# Patient Record
Sex: Female | Born: 1980 | Race: Black or African American | Hispanic: No | Marital: Single | Smoking: Never smoker
Health system: Southern US, Community
[De-identification: ages and names within clinical notes are randomized; demographics above are authoritative.]

## PROBLEM LIST (undated history)

## (undated) DIAGNOSIS — I10 Essential (primary) hypertension: Secondary | ICD-10-CM

## (undated) HISTORY — PX: WISDOM TOOTH EXTRACTION: SHX21

---

## 2003-05-17 ENCOUNTER — Emergency Department (HOSPITAL_COMMUNITY): Admission: EM | Admit: 2003-05-17 | Discharge: 2003-05-17 | Payer: Self-pay | Admitting: Emergency Medicine

## 2004-09-26 ENCOUNTER — Emergency Department (HOSPITAL_COMMUNITY): Admission: EM | Admit: 2004-09-26 | Discharge: 2004-09-26 | Payer: Self-pay | Admitting: Emergency Medicine

## 2004-12-26 ENCOUNTER — Emergency Department (HOSPITAL_COMMUNITY): Admission: EM | Admit: 2004-12-26 | Discharge: 2004-12-26 | Payer: Self-pay | Admitting: Emergency Medicine

## 2005-03-26 ENCOUNTER — Inpatient Hospital Stay (HOSPITAL_COMMUNITY): Admission: AD | Admit: 2005-03-26 | Discharge: 2005-03-28 | Payer: Self-pay | Admitting: Obstetrics & Gynecology

## 2005-04-19 ENCOUNTER — Ambulatory Visit (HOSPITAL_COMMUNITY): Admission: RE | Admit: 2005-04-19 | Discharge: 2005-04-19 | Payer: Self-pay | Admitting: Obstetrics & Gynecology

## 2005-04-21 ENCOUNTER — Ambulatory Visit (HOSPITAL_COMMUNITY): Admission: AD | Admit: 2005-04-21 | Discharge: 2005-04-21 | Payer: Self-pay | Admitting: Obstetrics & Gynecology

## 2005-04-23 ENCOUNTER — Inpatient Hospital Stay (HOSPITAL_COMMUNITY): Admission: AD | Admit: 2005-04-23 | Discharge: 2005-05-01 | Payer: Self-pay | Admitting: Obstetrics & Gynecology

## 2006-07-19 IMAGING — CT CT ANGIO CHEST
2 of 5 series · 19 of 36 positions shown · IV contrast (CONTRAST)
Comparison: none

HISTORY: Dyspnea, decreased oxygen saturation, elevated d-dimer, recent
C-section

[Series 6089: — · axial · 0.79mm/px · z∈[+1621,+1826]mm · 16 of 383 slices shown (1 of 2)]
[im 21/383  lung]
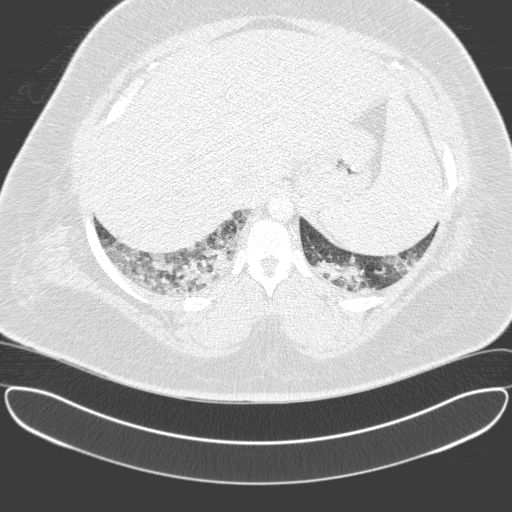
[im 41/383  mediastinal]
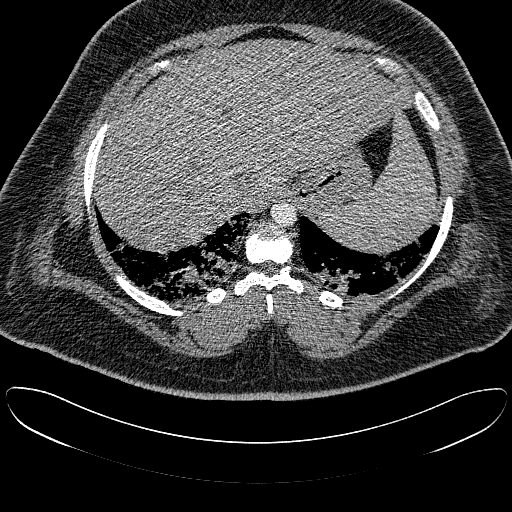
[im 61/383  lung]
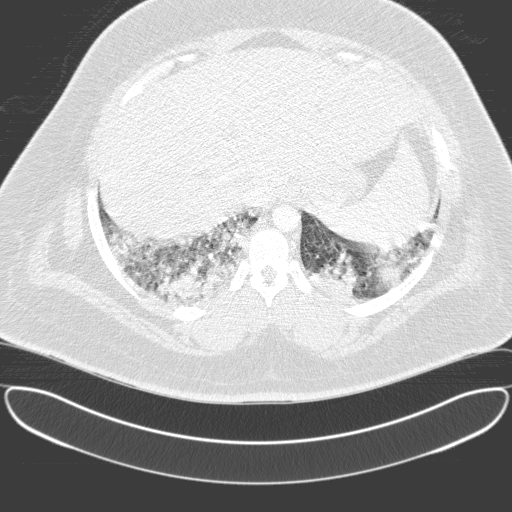
[im 81/383  mediastinal]
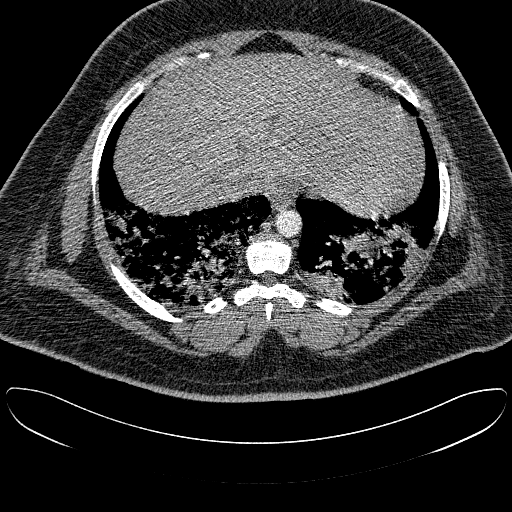
[im 121/383  lung]
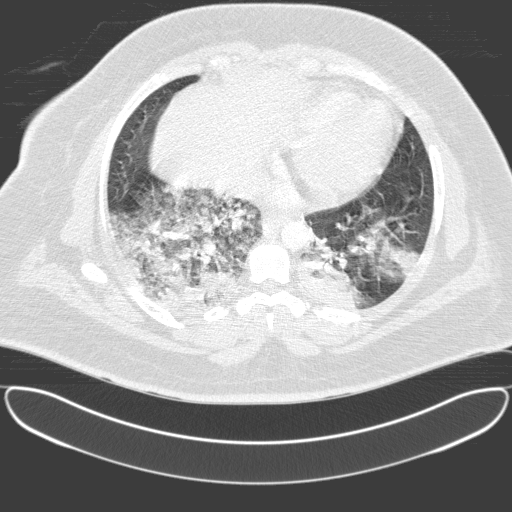
[im 141/383  mediastinal]
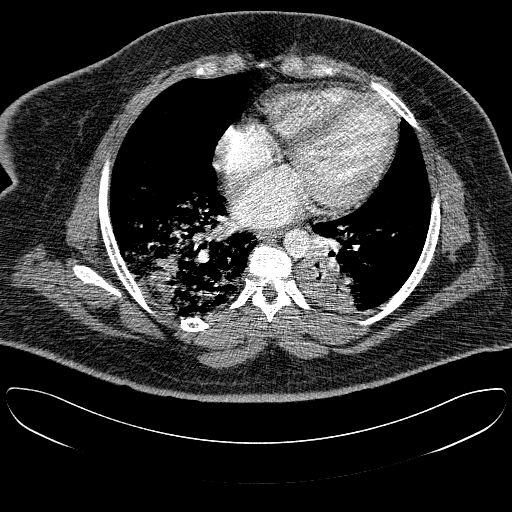
[im 161/383  lung]
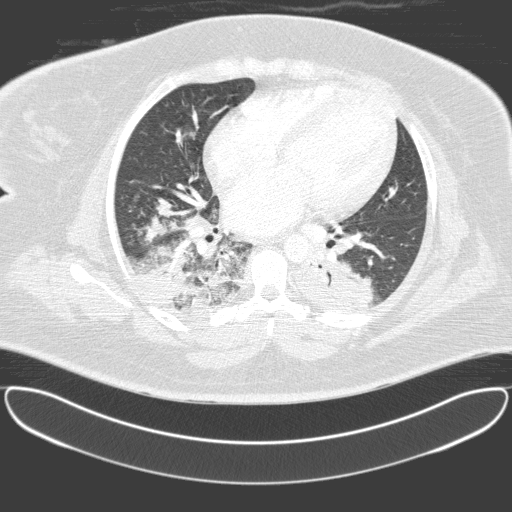
[im 181/383  mediastinal]
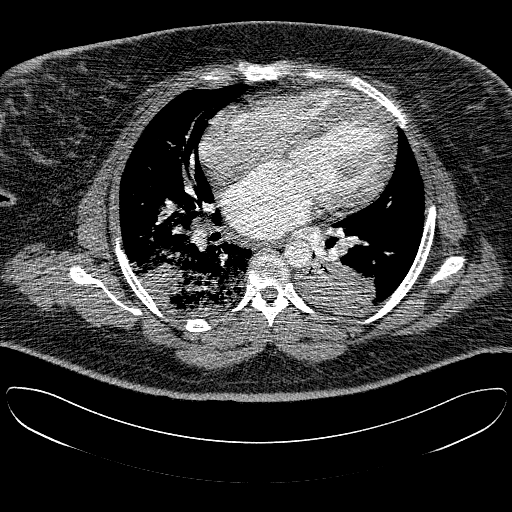
[im 202/383  lung]
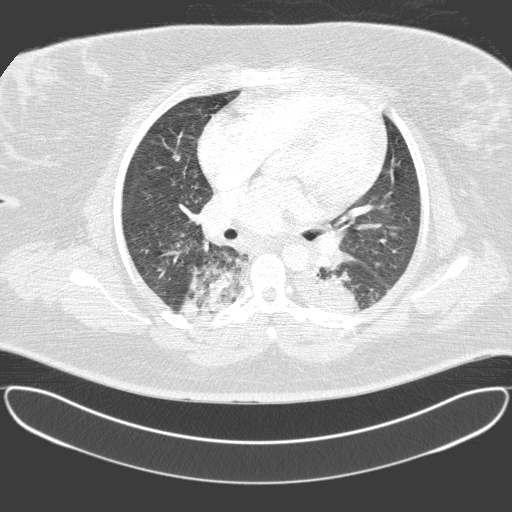
[im 222/383  mediastinal]
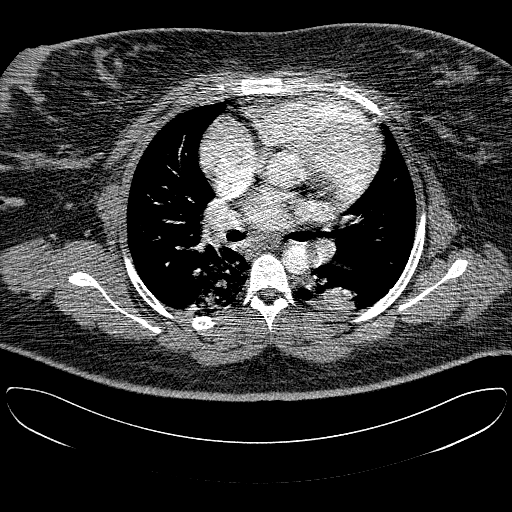
[im 242/383  lung]
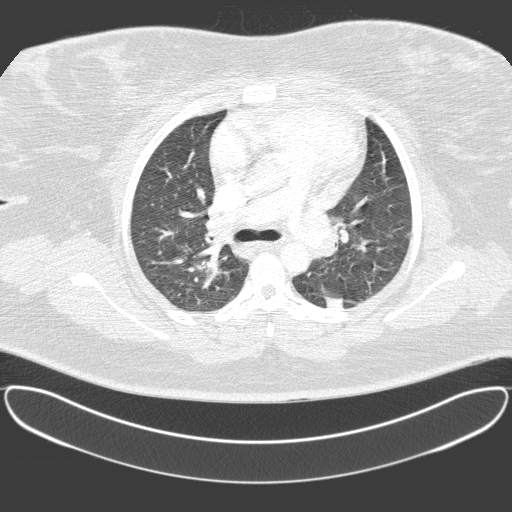
[im 262/383  mediastinal]
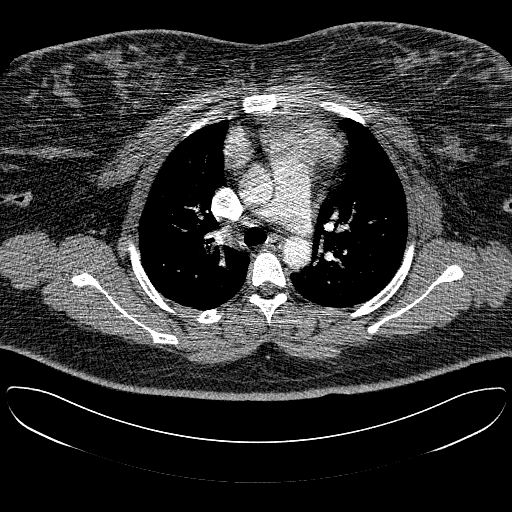
[im 302/383  lung]
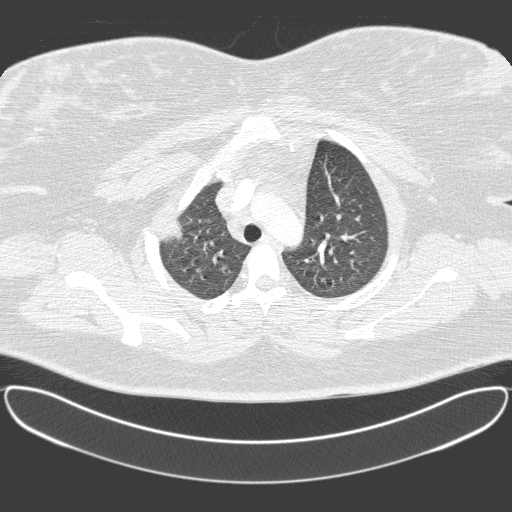
[im 322/383  mediastinal]
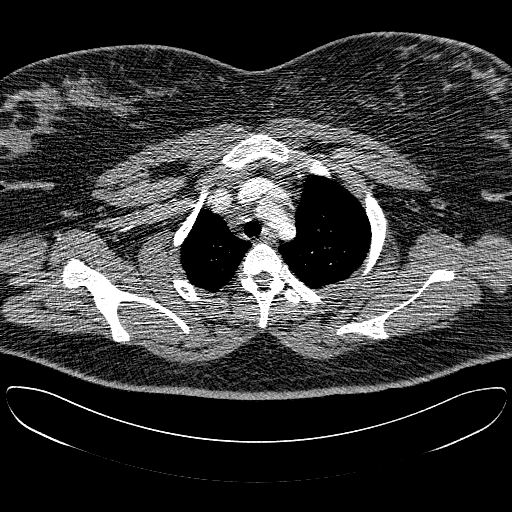
[im 342/383  lung]
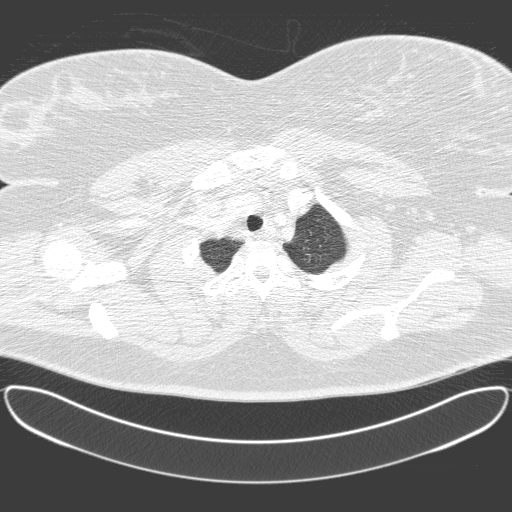
[im 362/383  mediastinal]
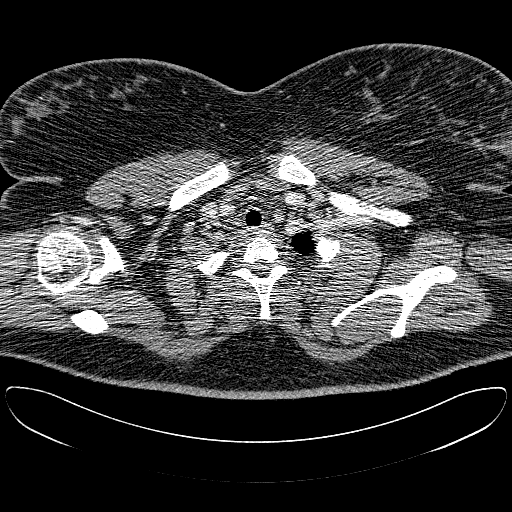

[— · coronal · 0.79mm/px · 3 of 90 slices shown (2 of 2)]
[im 18/90  mediastinal]
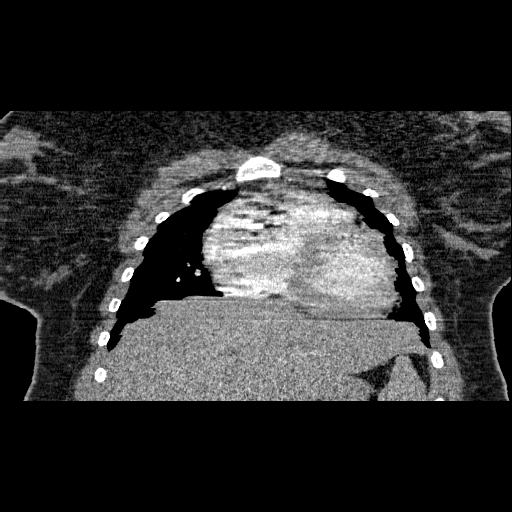
[im 36/90  mediastinal]
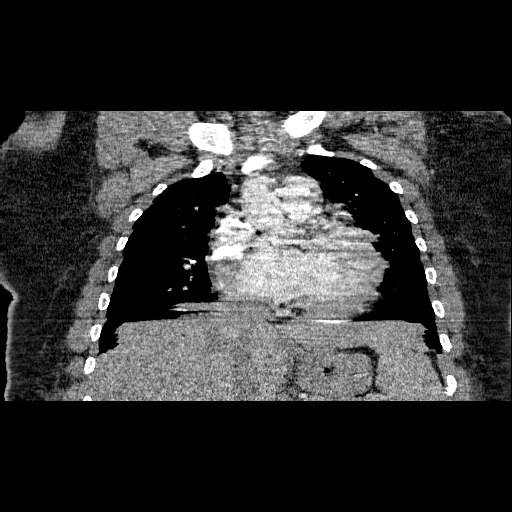
[im 54/90  mediastinal]
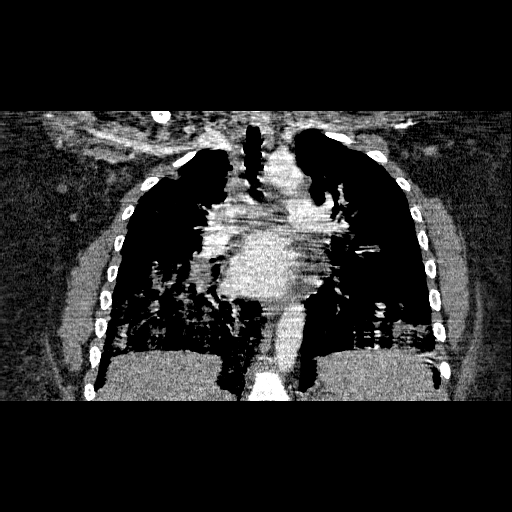

[19 of 36 positions shown; findings below may reference images not displayed]

CT ANGIO CHEST:

Multidetector helical CT imaging chest performed using pulmonary embolism
protocol following 150 cc [id number]. Additional coronal and sagittal images
are reconstructed from the axial data set to assist in evaluation of the
thoracic vasculature. 
No prior study for comparison.

Significant consolidation of bilateral lower lobes right greater than left.
Scattered respiratory motion artifacts.
Minimal right upper lobe infiltrate.
Extensive beam hardening secondary to patient's size.
No gross central pulmonary emboli seen though this exam is unable to exclude
pulmonary emboli due to technical limitations.
No mediastinal adenopathy or pleural effusion.
Bones unremarkable.
IMPRESSION: Extensive pulmonary infiltrates involving bilateral lower lobes and right upper
lobe.
Severely limited exam secondary to patient body habitus.
Nondiagnostic exam for presence of pulmonary emboli due to technical
limitations, unable to exclude pulmonary embolism.

## 2009-11-17 ENCOUNTER — Emergency Department (HOSPITAL_COMMUNITY): Admission: EM | Admit: 2009-11-17 | Discharge: 2009-11-17 | Payer: Self-pay | Admitting: Emergency Medicine

## 2010-01-10 ENCOUNTER — Emergency Department (HOSPITAL_COMMUNITY): Admission: EM | Admit: 2010-01-10 | Discharge: 2010-01-10 | Payer: Self-pay | Admitting: Emergency Medicine

## 2010-03-08 ENCOUNTER — Encounter: Payer: Self-pay | Admitting: Emergency Medicine

## 2010-04-30 LAB — GLUCOSE, CAPILLARY

## 2010-04-30 LAB — COMPREHENSIVE METABOLIC PANEL
ALT: 53 U/L — ABNORMAL HIGH (ref 0–35)
AST: 52 U/L — ABNORMAL HIGH (ref 0–37)
Alkaline Phosphatase: 53 U/L (ref 39–117)
CO2: 21 mEq/L (ref 19–32)
Calcium: 9 mg/dL (ref 8.4–10.5)
Chloride: 100 mEq/L (ref 96–112)
GFR calc non Af Amer: >60 mL/min (ref >60)
Glucose, Bld: 312 mg/dL — ABNORMAL HIGH (ref 70–99)
Sodium: 131 mEq/L — ABNORMAL LOW (ref 135–145)
Total Bilirubin: 0.2 mg/dL — ABNORMAL LOW (ref 0.3–1.2)

## 2010-04-30 LAB — URINALYSIS, ROUTINE W REFLEX MICROSCOPIC
Bilirubin Urine: NEGATIVE
Hgb urine dipstick: NEGATIVE
Ketones, ur: 15 mg/dL — AB
Nitrite: NEGATIVE
Urobilinogen, UA: 0.2 mg/dL (ref 0.0–1.0)

## 2010-04-30 LAB — URINE MICROSCOPIC-ADD ON

## 2010-04-30 LAB — DIFFERENTIAL
Basophils Absolute: 0.1 10*3/uL (ref 0.0–0.1)
Eosinophils Absolute: 0.1 10*3/uL (ref 0.0–0.7)
Eosinophils Relative: 2 % (ref 0–5)
Lymphs Abs: 1.7 10*3/uL (ref 0.7–4.0)
Monocytes Absolute: 0.4 10*3/uL (ref 0.1–1.0)
Neutrophils Relative %: 56 % (ref 43–77)

## 2010-04-30 LAB — CBC
HCT: 36 % (ref 36.0–46.0)
MCH: 24.1 pg — ABNORMAL LOW (ref 26.0–34.0)
MCV: 73.8 fL — ABNORMAL LOW (ref 78.0–100.0)
RDW: 15.1 % (ref 11.5–15.5)
WBC: 5.1 10*3/uL (ref 4.0–10.5)

## 2010-04-30 LAB — LIPASE, BLOOD: Lipase: 51 U/L (ref 11–59)

## 2010-04-30 LAB — KETONES, QUALITATIVE: Acetone, Bld: NEGATIVE

## 2010-04-30 LAB — POCT PREGNANCY, URINE: Preg Test, Ur: NEGATIVE

## 2010-07-04 NOTE — Op Note (Signed)
NAMESTARLIT, RABURN              ACCOUNT NO.:  [id number]   MEDICAL RECORD NO.:  [id number]          PATIENT TYPE:  INP   LOCATION:  A401                          FACILITY:  [facility]   PHYSICIAN:  Lazaro Arms, M.D.   DATE OF BIRTH:  08-31-1980   DATE OF PROCEDURE:  04/23/2005  DATE OF DISCHARGE:                                 OPERATIVE REPORT   PREOPERATIVE DIAGNOSES:  1.  Intrauterine pregnancy at 35 weeks' gestation.  2.  Severe preeclampsia.  3.  Insulin-dependent diabetes.  4.  Morbid obesity.   POSTOPERATIVE DIAGNOSES:  1.  Intrauterine pregnancy at 35 weeks' gestation.  2.  Severe preeclampsia.  3.  Insulin-dependent diabetes.  4.  Morbid obesity.   PROCEDURE:  Primary Cesarean section.   SURGEON:  Lazaro Arms, M.D.   ANESTHESIA:  Spinal.   FINDINGS:  Over a low transverse hysterotomy incision was delivered a viable  female infant with Apgars of 8 and 9 with weight to be determined in the  nursery. There was a three-vessel cord. Cord blood and cord gas sent. The  uterus, tubes and ovaries were normal. The placenta was normal. The infant  underwent routine neonatal resuscitation.   DESCRIPTION OF OPERATION:  The patient was taken to the operating room and  placed in a sitting position and underwent a spinal anesthetic, placed in a  supine position with a role under her right hip. She was prepped and draped  in usual sterile fashion. Pfannenstiel skin incision was made and carried  down sharply to the rectus fascia which was scored in the midline and  extended laterally. The fascial was taken off of the muscles superiorly and  inferiorly without difficulty. The muscles were divided. Peritoneal cavity  was entered. Bladder flap was placed. Vesicouterine serosal flaps were  created. Low transverse hysterotomy incision was made. Over this incision  was delivered a viable female infant, Apgars 8 and 9. The infant was handed to  Dr. Milford Cage for routine resuscitation. The  uterus was exteriorized. The  placenta was delivered spontaneously. The uterus was closed in two layers,  the first being a running interlocking layer, the second being an  imbricating layer. Several interrupted figure-of-eight sutures had to be  placed for hemostasis. The uterus, tubes and ovaries were normal. The uterus  was replaced into the peritoneal cavity. Both pericolic gutters were  irrigated and made hemostatic. The muscles and peritoneum were  reapproximated loosely. The fascia closed using 0 Vicryl running.  Subcutaneous tissue was made hemostatic and  irrigated. The skin was closed using skin staples. A JP drain was placed in  the subcutaneous tissue and exited in the right lower quadrant. The patient  tolerated the procedure well. I estimated the blood loss at 750 cc. She  received Ancef 1 g  prophylactically after cord was clamped.      Lazaro Arms, M.D.  Electronically Signed     LHE/MEDQ  D:  04/23/2005  T:  04/24/2005  Job:  [id number]

## 2010-07-04 NOTE — Group Therapy Note (Signed)
[name], [name]              ACCOUNT NO.:  [id number]   MEDICAL RECORD NO.:  [id number]          PATIENT TYPE:  INP   LOCATION:  A412                          FACILITY:  [facility]   PHYSICIAN:  Edward L. Juanetta Gosling, M.D.DATE OF BIRTH:  05/22/1980   DATE OF PROCEDURE:  04/29/2005  DATE OF DISCHARGE:                                   PROGRESS NOTE   PROBLEM:  Pneumonia.   SUBJECTIVE:  Ms. Kiger seems to be taking deeper breaths this morning.  She  is more alert.  She had a CT that was an indeterminate, but my suspicion is  not strong that she has got a pulmonary embolus anyway, so I think it is  okay to put her back on prophylactic dose of Lovenox.   OBJECTIVE:  VITAL SIGNS:  Her temperature 97.8, pulse 100, respirations 24,  blood pressure 144/80, blood sugar is 126, O2 saturations 98%.   The hemoglobin level is 8.4.   ASSESSMENT:  She seems better.   PLAN:  Plan is to continue her medications and treatments and follow.  No  other new treatments today.  I have discussed her situation with Dr. Despina Hidden.      Edward L. Juanetta Gosling, M.D.  Electronically Signed     ELH/MEDQ  D:  04/29/2005  T:  05/01/2005  Job:  [id number]

## 2010-07-04 NOTE — Group Therapy Note (Signed)
[name], [name]              ACCOUNT NO.:  [id number]   MEDICAL RECORD NO.:  [id number]          PATIENT TYPE:  INP   LOCATION:  A412                          FACILITY:  [facility]   PHYSICIAN:  Edward L. Juanetta Gosling, M.D.DATE OF BIRTH:  Jun 06, 1980   DATE OF PROCEDURE:  05/01/2005  DATE OF DISCHARGE:                                   PROGRESS NOTE   SUBJECTIVE:  [name] says she feels great and wants to go home.  She has  no new problems and no new complaints.  She is now on 2 L of O2.   OBJECTIVE:  GENERAL:  She is very comfortable in appearance.  CHEST:  Much clearer.  HEART:  Regular.  ABDOMEN:  Soft.   ASSESSMENT:  Dr. Despina Hidden feels that from his surgical point of view, she can go  home.  I feel the same as far as her pneumonia is concerned and she can take  her antibiotics as an outpatient.   PLAN:  She will be discharged to home.      Edward L. Juanetta Gosling, M.D.  Electronically Signed     ELH/MEDQ  D:  05/01/2005  T:  05/03/2005  Job:  [id number]

## 2010-07-04 NOTE — Group Therapy Note (Signed)
[name], [name]              ACCOUNT NO.:  [id number]   MEDICAL RECORD NO.:  [id number]          PATIENT TYPE:  INP   LOCATION:  A412                          FACILITY:  [facility]   PHYSICIAN:  Edward L. Juanetta Gosling, M.D.DATE OF BIRTH:  Sep 01, 1980   DATE OF PROCEDURE:  04/30/2005  DATE OF DISCHARGE:                                   PROGRESS NOTE   SUBJECTIVE:  [name] says she feels better and, in fact, looks much  better.  She is more alert.  She is breathing better.  She is coughing and  bringing up some sputum.  Otherwise, there does not seem to be a lot of  change.  She has not had any fever this morning.   OBJECTIVE:  CHEST:  Her chest is clear.  HEART:  Heart is regular.   Lab work is pending.   ASSESSMENT:  Overall, I think she is getting better.  She does have asthma.  She has pneumonia.  She has morbid obesity.  She has just had a C-section  and she is receiving prophylactic dose of Lovenox.  Basically, however, she  seems to be doing fairly well and getting better.   PLAN:  I have asked that she have her O2 reduced as long as she stays with a  good oxygen saturation and will see if we can get her down to a more  reasonable FIO2.  No other changes today.  She says she wants to talk to Dr.  Despina Hidden about going home, but I told her she needs to have her oxygen level  better on less oxygen first.      Edward L. Juanetta Gosling, M.D.  Electronically Signed     ELH/MEDQ  D:  04/30/2005  T:  05/01/2005  Job:  [id number]

## 2010-07-04 NOTE — Discharge Summary (Signed)
[name], [name] NO.:  [id number]   MEDICAL RECORD NO.:  [id number]          PATIENT TYPE:  OIB   LOCATION:  LDR3                          FACILITY:  [facility]   PHYSICIAN:  Tilda Burrow, M.D. DATE OF BIRTH:  1980/05/13   DATE OF ADMISSION:  04/19/2005  DATE OF DISCHARGE:  03/04/2007LH                                 DISCHARGE SUMMARY   OBSERVATION COURSE:  This  30 year old, primiparous female due April 11,  followed through her pregnancy at [facility] with prenatal care  complicated by diabetes mellitus and hypertension as well as morbid obesity.  She presents this afternoon after calling and reporting that she is having  some headache and right upper abdominal pain.  She is brought to labor and  delivery where evaluation reveals blood pressures that are significantly  elevated with initial blood pressures 183/110.  Review of prenatal records  show that she has had blood pressures consistently throughout the pregnancy  as high as 160/90.  She has been on Aldomet at 500 mg four times a day.  She  was begun on Aldomet in early February.  She has documented blood pressures  back in January as high as 160/90.  First trimester blood pressures ranged  from 110-132/80.  She has been on insulin with progressively increasing  doses, most recently increased to 40 units NPH and 20 of regular q.a.m. with  24 of NPH and 28 of regular q.p.m.  She states her fasting blood sugars are  in the 90s.  She was begun on NSTs at 32 weeks.  She is currently 34+ weeks'  gestation.  The patient has a history of asthma and has an inhaler.   Evaluation tonight includes recheck of blood pressures which shows mild  improvement with bed rest and limited activity.  Fetal heart rate tracing  meets reactive criteria with no contractions noted.  She has catheterized  urinalysis which shows trace esterase, negative for nitrates, white cells,  etc.  Hemoglobin is 9.5, hematocrit 30.1,  white count 6400, platelets  231,000.  Liver function tests are ordered which shows normal AST of 15 and  ALT of 12.   PHYSICAL EXAMINATION:  GENERAL:  Morbidly obese, African-American female  resting comfortably with complaints of mild headache and mild respiratory  discomfort.  CHEST:  Clear to auscultation with no wheezing, rales or rhonchi.  Breathing  is unlabored.  ABDOMEN:  Morbidly obese.  Fetal heart rate reactive by external monitoring.  PELVIC:  Deferred due to absence of contractions.  EXTREMITIES:  1+ edema, unchanged from prior exams by me much earlier in the  pregnancy.  NEUROLOGIC:  Reflexes are 1+ to 2+.   IMPRESSION:  1.  Uncontrolled gestational hypertension.  2.  Stable diabetes mellitus.  3.  Morbid obesity, stable.  4.  No evidence of superimposed preeclampsia at this time.   PLAN:  1.  The patient to take her evening dose of Aldomet at this time.  2.  Will add labetalol 100 mg p.o. b.i.d., prescription given, along with      initial dose.  3.  Nonstress test biweekly to be continued on Tuesday and Friday to      coordinate with office visit on Tuesdays.  4.  Will check hemoglobin A1c.  5.  The patient to call for any increase in headaches, right upper quadrant      pain, etc.      Tilda Burrow, M.D.  Electronically Signed     JVF/MEDQ  D:  04/19/2005  T:  04/20/2005  Job:  [id number]   cc:   [facility]

## 2010-07-04 NOTE — H&P (Signed)
[name], [name]              ACCOUNT NO.:  [id number]   MEDICAL RECORD NO.:  [id number]          PATIENT TYPE:  INP   LOCATION:  A415                          FACILITY:  [facility]   PHYSICIAN:  Lazaro Arms, M.D.   DATE OF BIRTH:  1980-05-18   DATE OF ADMISSION:  03/26/2005  DATE OF DISCHARGE:  LH                                HISTORY & PHYSICAL   HISTORY OF PRESENT ILLNESS:  [name] is a 30 year old African American  female, gravida 1, para 0, estimated date of delivery May 20, 2005,  currently at [redacted] weeks gestation who technically has Class A-2 diabetes but  probably functionally is a Class B diabetic.  She was started on insulin  during this pregnancy quite early on, requiring quite a large amount.  The  last couple of times she has come in over the past week, she has had  elevated blood pressures with negative protein and today she has a trace of  protein.  Her baseline blood pressure is 130/80.  As a result, she is  admitted to the hospital for evaluation of possible development of  preeclampsia.  We also work hard toward getting her blood sugars under  better control.  I just recently went up on her insulin and that has helped  but certainly they are not under optimal control yet.   PAST MEDICAL HISTORY:  1.  Borderline diabetes.  2.  Asthma.   PAST SURGICAL HISTORY:  Negative.   PAST OBSTETRIC HISTORY:  Negative.   MEDICATIONS:  Inhaler p.r.n.   REVIEW OF SYSTEMS:  Otherwise negative.   LABORATORY DATA:  Pending.  Blood type is 0 positive.  Antibody screen is  negative.  Rubella is immune.  Hepatitis B is negative.  HIV negative.  Serology is non-reactive.  GC and Chlamydia were negative.  AFP she  declined.   IMPRESSION:  1.  Intrauterine pregnancy at [redacted] weeks gestation.  2.  Class A-2 diabetes.  3.  Evaluate for preeclampsia.  4.  Morbid obesity.   PLAN:  The patient is admitted for evaluation of possible development of  preeclampsia and improved  patterning of her glucose.  She will also get an  ultrasound while in the hospital and undergo antepartum fetal testing.      Lazaro Arms, M.D.  Electronically Signed     LHE/MEDQ  D:  03/26/2005  T:  03/26/2005  Job:  [id number]

## 2010-07-04 NOTE — Discharge Summary (Signed)
NAMEBURGUNDY, [name]              ACCOUNT NO.:  [id number]   MEDICAL RECORD NO.:  [id number]          PATIENT TYPE:  INP   LOCATION:  A415                          FACILITY:  [facility]   PHYSICIAN:  Tilda Burrow, M.D. DATE OF BIRTH:  1980/11/10   DATE OF ADMISSION:  03/26/2005  DATE OF DISCHARGE:  02/10/2007LH                                 DISCHARGE SUMMARY   ADMITTING DIAGNOSES:  1.  Pregnancy [redacted] weeks gestation.  2.  Morbid obesity.  3.  Class A2 diabetes.  4.  Rule out preeclampsia.   DISCHARGE DIAGNOSES:  1.  Pregnancy [redacted] weeks gestation not delivered.  2.  Class A2 diabetes.  3.  Morbid obesity.  4.  Preeclampsia ruled out.   DISCHARGE INSTRUCTIONS:  1.  Discharge meds:  Insulin 40 units NPH every morning, 28 units Regular      every morning; evening insulin 34 units NPH, 28 units Regular every      evening.  2.  ADA diet.  3.  Follow up 1 week [facility].  4.  Discharge instructions include patient to check blood sugars 2 hours      postprandially, maintain blood sugar log, patient to get blood pressure      cuff from relative and maintain home blood pressures twice daily.   HOSPITAL SUMMARY:  A 30 year old African-American female that is admitted as  described in Dr. Forestine Chute HPI.  Concerns were over blood pressures  particularly and she was admitted to rule out preeclampsia.   HOSPITAL COURSE:  The patient was admitted with initial blood pressure of  161/88.  Subsequent blood pressures were much better with blood pressures of  145-168 systolic over 70-82 diastolic.  She had documented blood pressures  as low as 108/75 which would be labile at 145/76 two hours later.  Reflexes  remained 2+ without clonus.  Evaluation included 24-hour urine study for  total protein and creatinine clearance which showed protein excretion of 247  mg/day.  Urine proteins sent to the lab were all trace.  Liver function  tests were normal with AST of 13, ALT of 12.  Uric acid was  4.8, normal.  The patient was mildly anemic with hemoglobin 9.7, hematocrit 29.1,  platelets are 233,000.  Hemoglobin A1c was surprisingly quite good at 7.1.   The patient's status remained sufficiently stable for outpatient management.  Lengthy discussion with the patient and the patient's family regarding  placing an increasing emphasis on diabetes control.  The patient seemed to  be afraid to have rigid control as if she would be starving her baby and  she seems surprised to find out that blood sugar control does not jeopardize  the baby.  Additionally, the patient's family has multiple family members  that are involved as nurses aides here and elsewhere.  They are going to  increase efforts to help her control her blood sugars.  Particularly her aunt will be involved in the future.  Ms. Sultan is to  receive a home blood pressure cuff monitor from her aunt and will monitor  blood pressure twice daily at home  and bring report to office along with  routine blood sugars.  Discharge midday March 28, 2005.  Follow up 1  week.      Tilda Burrow, M.D.  Electronically Signed     JVF/MEDQ  D:  03/28/2005  T:  03/28/2005  Job:  [id number]

## 2010-07-04 NOTE — Consult Note (Signed)
[name], [name]              ACCOUNT NO.:  [id number]   MEDICAL RECORD NO.:  [id number]          PATIENT TYPE:  INP   LOCATION:  A412                          FACILITY:  [facility]   PHYSICIAN:  Edward L. Juanetta Gosling, M.D.DATE OF BIRTH:  03-24-1980   DATE OF CONSULTATION:  04/28/2005  DATE OF DISCHARGE:                                   CONSULTATION   REFERRING PHYSICIAN:  Lazaro Arms, M.D.   REASON FOR CONSULTATION:  Pneumonia, hypoxemia.   HISTORY OF PRESENT ILLNESS:  [name] is a 30 year old African-American  female, who had a C-section done earlier this month.  She had preeclampsia  and had the C-section because of that.  She has a history of asthma and uses  a metered dose inhaler at home.  Otherwise she says she has been fairly  healthy.  She does have problems with morbid obesity and during her  pregnancy because of insulin dependent diabetes and her blood pressure she  was treated with Aldomet.  Since her C-section she has developed a pneumonia  and she has had problems with oxygenation.  She has had some difficulty with  getting up and moving around and has been reluctant to cough because of  pain.  Dr. Despina Hidden had called me earlier today for a curbside consult but  since then she has gotten worse and he has requesting formal consultation.   PAST MEDICAL HISTORY:  1.  Positive for diabetes.  2.  Asthma.   PAST SURGICAL HISTORY:  Otherwise is negative.   SOCIAL HISTORY:  She is a nonsmoker.  No alcohol she says to speak of.   FAMILY HISTORY:  Positive for asthma and diabetes.   REVIEW OF SYSTEMS:  Except as mentioned is pretty much negative.   MEDICATIONS:  1.  Prior to the surgery, Aldomet 2 g a day in four divided doses.  2.  NPH insulin in the morning, 40, 28 of Regular in the morning, 34 and 28      in the evening.  Her blood sugars have apparently done pretty well.   ALLERGIES:  She has no known drug allergies   PHYSICAL EXAMINATION:  GENERAL:  She is using an  oxygen mask.  She is  morbidly obese.  VITAL SIGNS:  Blood pressure 122/74, pulse is 80.  Her respirations are 22  and slightly labored.  She is afebrile right now.  HEENT:  Her pupils are reactive.  Nose and throat are clear.  NECK:  Supple without masses.  CHEST:  Shows decreased breath sounds, bilateral wheezes.  I do not hear any  rales.  HEART:  Regular with decreased heart sounds.  ABDOMEN:  Soft, obese, without masses.  EXTREMITIES:  Do not show any definite cords.  She has edema of both legs.   ASSESSMENT:  She has pneumonia.  She is not oxygenated well.  I think some  of that is because she has simply not been able to take deep breaths, cough,  etc.  I am going to go ahead and start her on incentive spirometry every  hour, have her take Mucomyst with  her nebulizer treatments, have her use  Humibid L.A. to help with breaking up sputum, and ask for a D-dimer to be  done.  If her D-dimer is greater than 2, we should go ahead and do a CT  chest with pulmonary embolus protocol because she is obviously high risk for  that.  I would continue with Zosyn as we discussed earlier.  She will give  her nebulizer treatments more frequently and continue with her medications  otherwise.  She may need the addition of steroids but that of course is  going to cause problems with her blood sugar control.  Thanks for allowing  me to see her with you.      Edward L. Juanetta Gosling, M.D.  Electronically Signed     ELH/MEDQ  D:  04/28/2005  T:  04/28/2005  Job:  [id number]

## 2010-07-04 NOTE — Discharge Summary (Signed)
NAMESAAVI, [name]              ACCOUNT NO.:  [id number]   MEDICAL RECORD NO.:  [id number]          PATIENT TYPE:  INP   LOCATION:  A412                          FACILITY:  [facility]   PHYSICIAN:  Lazaro Arms, M.D.   DATE OF BIRTH:  09/02/1980   DATE OF ADMISSION:  04/23/2005  DATE OF DISCHARGE:  03/16/2007LH                                 DISCHARGE SUMMARY   DISCHARGE DIAGNOSES:  1.  Status post a primary cesarean section.  2.  Insulin-dependent diabetes.  3.  Asthma.  4.  Hospital-acquired pneumonia.   PROCEDURE:  Primary cesarean section.   Please refer to the history and physical as well as the antepartum chart for  details of her admission to the hospital.   HOSPITAL COURSE:  The patient was admitted for severe preeclampsia  superimposed on gestational hypertension.  She is an insulin-dependent  diabetic.  She had an unfavorable cervix and with the severe preeclampsia we  felt it was most prudent to go forward with a cesarean section.  She  underwent a cesarean section along with magnesium sulfate prophylaxis and  that went well.  However, postoperatively she developed fever and cough and  was found to have a pneumonia.  I consulted Dr. Shaune Pollack who continued to  see the patient with [place].  He put her on Zosyn.  Her O2 saturations had been  quite low but improved markedly with a combination of around-the-clock  breathing treatments and the antibiotics.  Her temperature curve also  improved at this time.  Her incision remained clean, dry and intact  throughout.  Her hemoglobin and hematocrit remained stable.  She was quite  anemic going in and had a hemoglobin of 7.3 and 22.1 and had to be  transfused three units in order to appropriately increase her hemoglobin and  hematocrit because of pneumonia.  She received six days of intra-hospital IV  antibiotics and was sent home on O2 as well.  She will be seen by Dr.  Juanetta Gosling in the following week.  She will be seen by [place] for  continued  management of her incision.  Her incision was clean, dry and intact.  There  was no evidence of ongoing intraperitoneal bleeding.  She was discharged on  Tylox for pain, Augmentin, Motrin, Nystatin and of course her insulin.      Lazaro Arms, M.D.  Electronically Signed     LHE/MEDQ  D:  08/17/2005  T:  08/17/2005  Job:  [id number]

## 2010-07-04 NOTE — H&P (Signed)
NAMEVELMER, BROADFOOT              ACCOUNT NO.:  [id number]   MEDICAL RECORD NO.:  [id number]          PATIENT TYPE:  OIB   LOCATION:  LDR3                          FACILITY:  [facility]   PHYSICIAN:  Lazaro Arms, M.D.   DATE OF BIRTH:  March 22, 1980   DATE OF ADMISSION:  04/23/2005  DATE OF DISCHARGE:  LH                                HISTORY & PHYSICAL   HISTORY OF PRESENT ILLNESS:  Anntonette is a 30 year old African American  female, gravida 1, para 0 at [redacted] weeks gestation, EDD of May 27, 2005.  Zakayla has insulin-dependent diabetes, class B.  She also has gestational  hypertension.  She has now developed severe preeclampsia.  She was admitted  to the hospital approximately a month ago for a workup and was found to have  a normal 24-hour urine.  All of her labs were fine.  She appeared to have  purely gestational hypertension.  However, she came in two days ago with a  blood pressure of 160/100 despite being on 2 g of Aldomet and 3+ protein.  We did a 24-hour urine which has come back as 4575 mg of protein.  As a  result, we are going to proceed with a cesarean section today for severe  preeclampsia, and she is going to be placed on magnesium sulfate  prophylaxis.  All of her other labs are fine.   PAST MEDICAL HISTORY:  1.  Insulin-dependent diabetes.  2.  Asthma.   PAST SURGICAL HISTORY:  Negative.   PAST OBSTETRICAL HISTORY:  Negative.   MEDICATIONS:  1.  Aldomet 2 g a day in four divided doses.  2.  NPH insulin in the morning of 40 and regular of 28 and in the evening,      34 of NPH and 28 of regular.  Her blood sugars have been reasonable in      the last few weeks.   ALLERGIES:  NONE.   REVIEW OF SYSTEMS:  She just has not felt well for the past few days.  Otherwise, nothing specific.   PHYSICAL EXAMINATION:  HEENT:  Unremarkable.  NECK:  Thyroid is normal.  LUNGS:  Clear.  HEART:  Regular rate and rhythm without murmur, regurgitation, or gallop.  BREASTS:   Without mass, discharge, or skin changes.  ABDOMEN:  Benign.  No hepatosplenomegaly or mass.  She has normal external  genitalia.  The abdomen does have a fundal height of 43 cm.  She is morbidly  obese.  EXTREMITIES:  Warm with 3+ edema.  DTRs are 2+.  No clonus.   IMPRESSION:  1.  Uterine pregnancy at [redacted] weeks gestation.  2.  Severe preeclampsia.  3.  Insulin-dependent diabetes mellitus, class B.  4.  Morbid obesity.   PLAN:  The patient is admitted for a cesarean section.  She will undergo  magnesium sulfate prophylaxis, Dr. Milford Cage is notified of the patient's status,  and we will proceed.      Lazaro Arms, M.D.  Electronically Signed     LHE/MEDQ  D:  04/23/2005  T:  04/23/2005  Job:  [id number]

## 2010-12-28 ENCOUNTER — Emergency Department (HOSPITAL_COMMUNITY)
Admission: EM | Admit: 2010-12-28 | Discharge: 2010-12-28 | Disposition: A | Discharge status: Home / Self Care | Payer: Medicaid Other | Attending: Emergency Medicine | Admitting: Emergency Medicine

## 2010-12-28 ENCOUNTER — Emergency Department (HOSPITAL_COMMUNITY): Payer: Medicaid Other

## 2010-12-28 ENCOUNTER — Encounter: Payer: Self-pay | Admitting: *Deleted

## 2010-12-28 DIAGNOSIS — N83201 Unspecified ovarian cyst, right side: Secondary | ICD-10-CM

## 2010-12-28 DIAGNOSIS — N839 Noninflammatory disorder of ovary, fallopian tube and broad ligament, unspecified: Secondary | ICD-10-CM | POA: Insufficient documentation

## 2010-12-28 DIAGNOSIS — I10 Essential (primary) hypertension: Secondary | ICD-10-CM | POA: Insufficient documentation

## 2010-12-28 DIAGNOSIS — E119 Type 2 diabetes mellitus without complications: Secondary | ICD-10-CM | POA: Insufficient documentation

## 2010-12-28 DIAGNOSIS — R1011 Right upper quadrant pain: Secondary | ICD-10-CM | POA: Insufficient documentation

## 2010-12-28 DIAGNOSIS — K859 Acute pancreatitis without necrosis or infection, unspecified: Secondary | ICD-10-CM

## 2010-12-28 DIAGNOSIS — J45909 Unspecified asthma, uncomplicated: Secondary | ICD-10-CM | POA: Insufficient documentation

## 2010-12-28 HISTORY — DX: Essential (primary) hypertension: I10

## 2010-12-28 LAB — COMPREHENSIVE METABOLIC PANEL
Albumin: 3.4 g/dL — ABNORMAL LOW (ref 3.5–5.2)
Alkaline Phosphatase: 61 U/L (ref 39–117)
BUN: 15 mg/dL (ref 6–23)
Chloride: 100 mEq/L (ref 96–112)
Creatinine, Ser: 0.94 mg/dL (ref 0.50–1.10)
GFR calc Af Amer: >90 mL/min (ref >90)
Glucose, Bld: 310 mg/dL — ABNORMAL HIGH (ref 70–99)
Potassium: 4.2 mEq/L (ref 3.5–5.1)
Total Bilirubin: 0.4 mg/dL (ref 0.3–1.2)
Total Protein: 7.4 g/dL (ref 6.0–8.3)

## 2010-12-28 LAB — CBC
MCH: 23.7 pg — ABNORMAL LOW (ref 26.0–34.0)
MCHC: 31.6 g/dL (ref 30.0–36.0)
Platelets: 219 10*3/uL (ref 150–400)
RDW: 14.8 % (ref 11.5–15.5)

## 2010-12-28 LAB — URINALYSIS, ROUTINE W REFLEX MICROSCOPIC
Bilirubin Urine: NEGATIVE
Ketones, ur: 15 mg/dL — AB
Leukocytes, UA: NEGATIVE
Nitrite: NEGATIVE
Urobilinogen, UA: 0.2 mg/dL (ref 0.0–1.0)

## 2010-12-28 LAB — LIPASE, BLOOD: Lipase: 78 U/L — ABNORMAL HIGH (ref 11–59)

## 2010-12-28 LAB — URINE MICROSCOPIC-ADD ON

## 2010-12-28 LAB — POCT PREGNANCY, URINE: Preg Test, Ur: NEGATIVE

## 2010-12-28 MED ORDER — HYDROCODONE-ACETAMINOPHEN 5-325 MG PO TABS: 1.0000 | ORAL_TABLET | Freq: Four times a day (QID) | ORAL | Status: AC | PRN

## 2010-12-28 MED ORDER — ONDANSETRON HCL 4 MG/2ML IJ SOLN
4.0000 mg | Freq: Once | INTRAMUSCULAR | Status: AC
  Administered 2010-12-28: 4 mg via INTRAVENOUS
  Filled 2010-12-28: qty 2

## 2010-12-28 MED ORDER — IOHEXOL 300 MG/ML  SOLN
100.0000 mL | Freq: Once | INTRAMUSCULAR | Status: AC | PRN
  Administered 2010-12-28: 100 mL via INTRAVENOUS

## 2010-12-28 MED ORDER — SODIUM CHLORIDE 0.9 % IV SOLN: INTRAVENOUS | Status: DC

## 2010-12-28 MED ORDER — PROMETHAZINE HCL 25 MG PO TABS: 25.0000 mg | ORAL_TABLET | Freq: Four times a day (QID) | ORAL | Status: DC | PRN

## 2010-12-28 MED ORDER — HYDROMORPHONE HCL PF 1 MG/ML IJ SOLN
1.0000 mg | Freq: Once | INTRAMUSCULAR | Status: AC
  Administered 2010-12-28: 1 mg via INTRAVENOUS
  Filled 2010-12-28: qty 1

## 2010-12-28 MED ORDER — SODIUM CHLORIDE 0.9 % IV BOLUS (SEPSIS)
1000.0000 mL | Freq: Once | INTRAVENOUS | Status: AC
  Administered 2010-12-28: 1000 mL via INTRAVENOUS

## 2010-12-28 NOTE — ED Notes (Signed)
Pt up ambulating to restroom

## 2010-12-28 NOTE — ED Notes (Signed)
Per EMS, pt began having abdominal pain in RUQ about 2 hours ago, after eating bacon and eggs.

## 2010-12-28 NOTE — ED Provider Notes (Signed)
History     CSN: [id number] Arrival date & time: 12/28/2010  1:51 AM   First MD Initiated Contact with Patient 12/28/10 0210      Chief Complaint  Patient presents with  . Abdominal Pain    (Consider location/radiation/quality/duration/timing/severity/associated sxs/prior treatment) Patient is a 30 y.o. female presenting with abdominal pain. The history is provided by the patient.  Abdominal Pain The primary symptoms of the illness include abdominal pain, fever and nausea. The primary symptoms of the illness do not include shortness of breath, vomiting, diarrhea, hematemesis or dysuria. The current episode started 3 to 5 hours ago. The onset of the illness was sudden. The problem has not changed since onset. The abdominal pain radiates to the RUQ. The severity of the abdominal pain is 10/10. The abdominal pain is relieved by nothing.  The patient states that she believes she is currently not pregnant. Symptoms associated with the illness do not include chills, diaphoresis, heartburn, urgency, hematuria, frequency or back pain. Significant associated medical issues include diabetes.    Past Medical History  Diagnosis Date  . Diabetes mellitus   . Hypertension   . Asthma     Past Surgical History  Procedure Date  . Cesarean section     History reviewed. No pertinent family history.  History  Substance Use Topics  . Smoking status: Never Smoker   . Smokeless tobacco: Not on file  . Alcohol Use: No    OB History    Grav Para Term Preterm Abortions TAB SAB Ect Mult Living                  Review of Systems  Constitutional: Positive for fever. Negative for chills and diaphoresis.  HENT: Negative for congestion and neck pain.   Eyes: Negative for redness.  Respiratory: Negative for cough and shortness of breath.   Cardiovascular: Negative for chest pain.  Gastrointestinal: Positive for nausea and abdominal pain. Negative for heartburn, vomiting, diarrhea and  hematemesis.  Genitourinary: Negative for dysuria, urgency, frequency and hematuria.  Musculoskeletal: Negative for back pain.  Neurological: Negative for headaches.  Hematological: Negative for adenopathy.    Allergies  Review of patient's allergies indicates no known allergies.  Home Medications   Current Outpatient Rx  Name Route Sig Dispense Refill  . ALBUTEROL SULFATE (2.5 MG/3ML) 0.083% IN NEBU Nebulization Take 2.5 mg by nebulization every 6 (six) hours as needed.      . INSULIN GLARGINE 100 UNIT/ML [place] SOLN Subcutaneous Inject 10 Units into the skin at bedtime.      Marland Kitchen LISINOPRIL 10 MG PO TABS Oral Take 10 mg by mouth daily.      Marland Kitchen METFORMIN HCL 1000 MG PO TABS Oral Take 1,000 mg by mouth daily with breakfast.      . METFORMIN HCL 500 MG PO TABS Oral Take 500 mg by mouth daily with breakfast.        BP 141/85  Pulse 78  Temp(Src) 97.8 F (36.6 C) (Oral)  Resp 18  Ht 5\' 8"  (1.727 m)  Wt 325 lb (147.419 kg)  BMI 49.42 kg/m2  SpO2 100%  LMP 12/28/2010  Physical Exam  Nursing note and vitals reviewed. Constitutional: She is oriented to person, place, and time. She appears well-developed and well-nourished.  HENT:  Head: Normocephalic and atraumatic.  Mouth/Throat: Oropharynx is clear and moist.  Eyes: Conjunctivae and EOM are normal. Pupils are equal, round, and reactive to light.  Neck: Normal range of motion. Neck supple.  Cardiovascular:  Normal rate, regular rhythm and normal heart sounds.   No murmur heard. Pulmonary/Chest: Effort normal and breath sounds normal.  Abdominal: Soft. Bowel sounds are normal. There is tenderness.       Right upper quadrant tenderness.  Musculoskeletal: Normal range of motion.  Neurological: She is alert and oriented to person, place, and time. No cranial nerve deficit. She exhibits normal muscle tone. Coordination normal.  Skin: Skin is warm. No rash noted.    ED Course  Procedures (including critical care time)  Labs Reviewed    GLUCOSE, CAPILLARY - Abnormal; Notable for the following:    Glucose-Capillary 320 (*)    All other components within normal limits  COMPREHENSIVE METABOLIC PANEL - Abnormal; Notable for the following:    Sodium 134 (*)    Glucose, Bld 310 (*)    Albumin 3.4 (*)    AST 282 (*)    ALT 105 (*)    GFR calc non Af Amer 82 (*)    All other components within normal limits  LIPASE, BLOOD - Abnormal; Notable for the following:    Lipase 78 (*)    All other components within normal limits  URINALYSIS, ROUTINE W REFLEX MICROSCOPIC - Abnormal; Notable for the following:    Specific Gravity, Urine >1.030 (*)    Glucose, UA >1000 (*)    Hgb urine dipstick TRACE (*)    Ketones, ur 15 (*)    Protein, ur 100 (*)    All other components within normal limits  CBC - Abnormal; Notable for the following:    Hemoglobin 11.0 (*)    HCT 34.8 (*)    MCV 74.8 (*)    MCH 23.7 (*)    All other components within normal limits  URINE MICROSCOPIC-ADD ON - Abnormal; Notable for the following:    Squamous Epithelial / LPF FEW (*)    All other components within normal limits  POCT PREGNANCY, URINE   Results for orders placed during the hospital encounter of 12/28/10  GLUCOSE, CAPILLARY      Component Value Range   Glucose-Capillary 320 (*) 70 - 99 (mg/dL)  COMPREHENSIVE METABOLIC PANEL      Component Value Range   Sodium 134 (*) 135 - 145 (mEq/L)   Potassium 4.2  3.5 - 5.1 (mEq/L)   Chloride 100  96 - 112 (mEq/L)   CO2 27  19 - 32 (mEq/L)   Glucose, Bld 310 (*) 70 - 99 (mg/dL)   BUN 15  6 - 23 (mg/dL)   Creatinine, Ser 1.61  0.50 - 1.10 (mg/dL)   Calcium 9.7  8.4 - 09.6 (mg/dL)   Total Protein 7.4  6.0 - 8.3 (g/dL)   Albumin 3.4 (*) 3.5 - 5.2 (g/dL)   AST 045 (*) 0 - 37 (U/L)   ALT 105 (*) 0 - 35 (U/L)   Alkaline Phosphatase 61  39 - 117 (U/L)   Total Bilirubin 0.4  0.3 - 1.2 (mg/dL)   GFR calc non Af Amer 82 (*) >90 (mL/min)   GFR calc Af Amer >90  >90 (mL/min)  LIPASE, BLOOD      Component  Value Range   Lipase 78 (*) 11 - 59 (U/L)  URINALYSIS, ROUTINE W REFLEX MICROSCOPIC      Component Value Range   Color, Urine YELLOW  YELLOW    Appearance CLEAR  CLEAR    Specific Gravity, Urine >1.030 (*) 1.005 - 1.030    pH 6.0  5.0 - 8.0  Glucose, UA >1000 (*) NEGATIVE (mg/dL)   Hgb urine dipstick TRACE (*) NEGATIVE    Bilirubin Urine NEGATIVE  NEGATIVE    Ketones, ur 15 (*) NEGATIVE (mg/dL)   Protein, ur 161 (*) NEGATIVE (mg/dL)   Urobilinogen, UA 0.2  0.0 - 1.0 (mg/dL)   Nitrite NEGATIVE  NEGATIVE    Leukocytes, UA NEGATIVE  NEGATIVE   CBC      Component Value Range   WBC 5.7  4.0 - 10.5 (K/uL)   RBC 4.65  3.87 - 5.11 (MIL/uL)   Hemoglobin 11.0 (*) 12.0 - 15.0 (g/dL)   HCT 09.6 (*) 04.5 - 46.0 (%)   MCV 74.8 (*) 78.0 - 100.0 (fL)   MCH 23.7 (*) 26.0 - 34.0 (pg)   MCHC 31.6  30.0 - 36.0 (g/dL)   RDW 40.9  81.1 - 91.4 (%)   Platelets 219  150 - 400 (K/uL)  POCT PREGNANCY, URINE      Component Value Range   Preg Test, Ur NEGATIVE    URINE MICROSCOPIC-ADD ON      Component Value Range   Squamous Epithelial / LPF FEW (*) RARE    WBC, UA 0-2  <3 (WBC/hpf)   RBC / HPF 0-2  <3 (RBC/hpf)   Bacteria, UA RARE  RARE    Ct Abdomen Pelvis W Contrast  12/28/2010  *RADIOLOGY REPORT*  Clinical Data: Periumbilical abdominal pain.  CT ABDOMEN AND PELVIS WITH CONTRAST  Technique:  Multidetector CT imaging of the abdomen and pelvis was performed following the standard protocol during bolus administration of intravenous contrast.  Contrast: OMNIPAQUE IOHEXOL 300 MG/ML IV SOLN  Comparison: Prior ultrasound of pregnancy performed 03/26/2005  Findings: The visualized lung bases are clear.  There is fatty infiltration within the liver, with mild sparing about the gallbladder fossa.  The liver is otherwise unremarkable in appearance.  The spleen is within normal limits.  The gallbladder is unremarkable; a phrygian cap is noted on the gallbladder. There is poorly characterized mild nodularity  along the right adrenal gland, without a definite mass.  The left adrenal gland is unremarkable in appearance.  The pancreas is unremarkable.  The kidneys are unremarkable in appearance.  There is no evidence of hydronephrosis.  No renal or ureteral stones are seen.  No perinephric stranding is appreciated.  No free fluid is identified.  The small bowel is unremarkable in appearance.  The stomach is filled with contrast and is within normal limits.  No acute vascular abnormalities are seen.  No periumbilical abnormalities are seen.  There is no evidence of an umbilical hernia.  The appendix is normal in caliber, without evidence for appendicitis.  The colon is unremarkable in appearance.  The bladder is mildly distended and within normal limits.  The uterus is unremarkable in appearance.  A tampon is noted at the vagina.  An apparent large 7.0 x 6.1 x 4.2 cm right adnexal cystic lesion extends into the pelvic cul-de-sac.  Pelvic ultrasound would be helpful for further evaluation.  No inguinal lymphadenopathy is seen.  No acute osseous abnormalities are identified.  IMPRESSION:  1.  Large 7.0 x 6.1 x 4.2 cm apparent right adnexal cystic lesion noted extending into the pelvic cul-de-sac.  Recommend pelvic ultrasound for further evaluation. 2.  Umbilicus unremarkable in appearance. 3.  Fatty infiltration within the liver.  Original Report Authenticated By: Tonia Ghent, M.D.      Impression: Abdominal pain Pancreatitis Right adnexal mass versus ovarian cyst   MDM   Issues  abdominal pain sniffily improved in the emergency department with 1 mg of Dilaudid. Labs consistent with mild pancreatitis. Patient has a history of diabetes blood sugar is elevated today of note during visit in October was elevated as well. CT is not showing gallstones or inflammation of the pancreas. Patient's pain is right upper quadrant she has no lower quadrant pain. However CT depicts a large ovarian cyst or adnexal mass on the  right side. Patient is followed by OB/GYN locally has been given a referral to followup in their office this week. The ovarian mass will need close followup. Patient instructed to go with liquid diet for 24 hours and return for any new or worse symptoms.       Shelda Jakes, MD 12/28/10 [phone]

## 2010-12-28 NOTE — ED Notes (Signed)
Pt resting and waiting on ride.

## 2012-04-30 ENCOUNTER — Emergency Department (HOSPITAL_COMMUNITY)
Admission: EM | Admit: 2012-04-30 | Discharge: 2012-04-30 | Disposition: A | Discharge status: Home / Self Care | Payer: Medicaid Other | Attending: Emergency Medicine | Admitting: Emergency Medicine

## 2012-04-30 ENCOUNTER — Encounter (HOSPITAL_COMMUNITY): Payer: Self-pay

## 2012-04-30 ENCOUNTER — Emergency Department (HOSPITAL_COMMUNITY): Payer: Medicaid Other

## 2012-04-30 DIAGNOSIS — R6883 Chills (without fever): Secondary | ICD-10-CM | POA: Insufficient documentation

## 2012-04-30 DIAGNOSIS — Z794 Long term (current) use of insulin: Secondary | ICD-10-CM | POA: Insufficient documentation

## 2012-04-30 DIAGNOSIS — R0602 Shortness of breath: Secondary | ICD-10-CM | POA: Insufficient documentation

## 2012-04-30 DIAGNOSIS — E119 Type 2 diabetes mellitus without complications: Secondary | ICD-10-CM | POA: Insufficient documentation

## 2012-04-30 DIAGNOSIS — R059 Cough, unspecified: Secondary | ICD-10-CM | POA: Insufficient documentation

## 2012-04-30 DIAGNOSIS — J189 Pneumonia, unspecified organism: Secondary | ICD-10-CM

## 2012-04-30 DIAGNOSIS — I10 Essential (primary) hypertension: Secondary | ICD-10-CM | POA: Insufficient documentation

## 2012-04-30 DIAGNOSIS — J159 Unspecified bacterial pneumonia: Secondary | ICD-10-CM | POA: Insufficient documentation

## 2012-04-30 DIAGNOSIS — R05 Cough: Secondary | ICD-10-CM | POA: Insufficient documentation

## 2012-04-30 DIAGNOSIS — Z79899 Other long term (current) drug therapy: Secondary | ICD-10-CM | POA: Insufficient documentation

## 2012-04-30 DIAGNOSIS — J45909 Unspecified asthma, uncomplicated: Secondary | ICD-10-CM | POA: Insufficient documentation

## 2012-04-30 MED ORDER — BENZONATATE 200 MG PO CAPS: 200.0000 mg | ORAL_CAPSULE | Freq: Three times a day (TID) | ORAL | Status: DC | PRN

## 2012-04-30 MED ORDER — AZITHROMYCIN 250 MG PO TABS
500.0000 mg | ORAL_TABLET | Freq: Once | ORAL | Status: AC
  Administered 2012-04-30: 500 mg via ORAL
  Filled 2012-04-30: qty 2

## 2012-04-30 MED ORDER — AZITHROMYCIN 250 MG PO TABS: ORAL_TABLET | ORAL | Status: DC

## 2012-04-30 NOTE — ED Notes (Signed)
Pt voices mult complaints related to cough and congestion

## 2012-04-30 NOTE — ED Provider Notes (Signed)
History     CSN: [id number]  Arrival date & time 04/30/12  1501   First MD Initiated Contact with Patient 04/30/12 1534      Chief Complaint  Patient presents with  . Nasal Congestion    (Consider location/radiation/quality/duration/timing/severity/associated sxs/prior treatment) HPI Comments: [name] is a 32 y.o. Female presenting with cough and congestion.  She describes nasal congestion with thick rhinnorhea,  Occasionally blood tinged along with post nasal drip,  Sometimes productive cough and wheezing which has been worse at night and does respond to her albuterol inhaler.  She denies shortness of breath at present,  Her last albuterol treatment was last night.  She has had chills,  But denies any fever.  Additionally, she denies chest pain, sore throat and ear ache but has had sinus pressure,  Especially across her bilateral cheeks.  She has chest pressure when she is wheezing, but responds generally to her inhaler.  She has been fatigued,  Denies weakness or dizziness and has no increased sob with exertion.  She has taken no medicines prior to arrival.     The history is provided by the patient.    Past Medical History  Diagnosis Date  . Diabetes mellitus   . Hypertension   . Asthma     Past Surgical History  Procedure Laterality Date  . Cesarean section      No family history on file.  History  Substance Use Topics  . Smoking status: Never Smoker   . Smokeless tobacco: Not on file  . Alcohol Use: No    OB History   Grav Para Term Preterm Abortions TAB SAB Ect Mult Living                  Review of Systems  Constitutional: Positive for chills. Negative for fever.  HENT: Positive for congestion, rhinorrhea and sinus pressure. Negative for sore throat, trouble swallowing and neck pain.   Eyes: Negative.   Respiratory: Positive for cough, shortness of breath and wheezing. Negative for chest tightness.   Cardiovascular: Negative for chest pain.   Gastrointestinal: Negative for nausea and abdominal pain.  Genitourinary: Negative.   Musculoskeletal: Negative for joint swelling and arthralgias.  Skin: Negative.  Negative for rash and wound.  Neurological: Negative for dizziness, weakness, light-headedness, numbness and headaches.  Psychiatric/Behavioral: Negative.     Allergies  Review of patient's allergies indicates no known allergies.  Home Medications   Current Outpatient Rx  Name  Route  Sig  Dispense  Refill  . insulin glargine (LANTUS) 100 UNIT/ML injection   Subcutaneous   Inject 20 Units into the skin at bedtime.          Marland Kitchen lisinopril (PRINIVIL,ZESTRIL) 10 MG tablet   Oral   Take 10 mg by mouth daily.           . metFORMIN (GLUCOPHAGE) 1000 MG tablet   Oral   Take 1,000 mg by mouth daily with breakfast.           . albuterol (PROVENTIL) (2.5 MG/3ML) 0.083% nebulizer solution   Nebulization   Take 2.5 mg by nebulization every 6 (six) hours as needed.           Marland Kitchen azithromycin (ZITHROMAX) 250 MG tablet      Take one tablet daily for 4 days,  Taking your first pill on Monday   6 tablet   0   . benzonatate (TESSALON) 200 MG capsule   Oral   Take 1  capsule (200 mg total) by mouth 3 (three) times daily as needed for cough.   20 capsule   0     BP 152/92  Pulse 100  Temp(Src) 98.4 F (36.9 C) (Oral)  Resp 20  Ht 5' 7.5" (1.715 m)  Wt 320 lb (145.151 kg)  BMI 49.35 kg/m2  SpO2 100%  LMP 04/26/2012  Physical Exam  Constitutional: She is oriented to person, place, and time. She appears well-developed and well-nourished.  HENT:  Head: Normocephalic and atraumatic.  Right Ear: Tympanic membrane and ear canal normal.  Left Ear: Tympanic membrane and ear canal normal.  Nose: Mucosal edema and rhinorrhea present. Right sinus exhibits maxillary sinus tenderness. Left sinus exhibits maxillary sinus tenderness.  Mouth/Throat: Uvula is midline, oropharynx is clear and moist and mucous membranes are  normal. No oropharyngeal exudate, posterior oropharyngeal edema, posterior oropharyngeal erythema or tonsillar abscesses.  Eyes: Conjunctivae are normal.  Cardiovascular: Normal rate and normal heart sounds.   Pulmonary/Chest: Effort normal. No respiratory distress. She has no decreased breath sounds. She has no wheezes. She has no rhonchi. She has no rales.  Abdominal: Soft. There is no tenderness.  Musculoskeletal: Normal range of motion.  Neurological: She is alert and oriented to person, place, and time.  Skin: Skin is warm and dry. No rash noted.  Psychiatric: She has a normal mood and affect.    ED Course  Procedures (including critical care time)  Labs Reviewed  RAPID STREP SCREEN   Dg Chest 2 View  04/30/2012  *RADIOLOGY REPORT*  Clinical Data: Cough.  Congestion.  Chest pressure.  CHEST - 2 VIEW  Comparison: 04/27/2005  Findings: Heart size is normal.  Mediastinal shadows are normal. The left lung is clear.  There is minimal density at the right base on the frontal view, where the patient had extensive pneumonia in 2007.  This could represent mild scarring or mild recurrent right lower lobe pneumonia.  No effusions.  No bony abnormalities.  IMPRESSION: Mild abnormal density in the right lower lobe, less than was seen in 2007 when the patient had extensive right lower lobe pneumonia. Today's findings could be due to scarring or recurrent right lower lobe pneumonia of a lesser degree.   Original Report Authenticated By: Paulina Fusi, M.D.      1. Community acquired pneumonia       MDM  With patients current sx,  Will cover for possible early pneumonia. She was placed on z pack as she also has sx suggestive of sinusitis, zpack will cover for both infections.  She was prescribed tessalon for cough.  Also encouraged rest,  Increased fluids,  Albuterol 2 puffs q 4 hours prn cough (pt has).  Tylenol or motrin for fever/chills and sinus pain.  Her home cbg's have been running 150-200.   States today's check was 160.  Not rechecked here.        Burgess Amor, PA-C 05/01/12 [phone]

## 2012-05-02 NOTE — ED Provider Notes (Signed)
Medical screening examination/treatment/procedure(s) were performed by non-physician practitioner and as supervising physician I was immediately available for consultation/collaboration.  Berenis Corter, MD 05/02/12 0648 

## 2012-12-15 ENCOUNTER — Ambulatory Visit (INDEPENDENT_AMBULATORY_CARE_PROVIDER_SITE_OTHER): Payer: Medicaid Other | Admitting: Obstetrics & Gynecology

## 2012-12-15 ENCOUNTER — Other Ambulatory Visit (HOSPITAL_COMMUNITY)
Admission: RE | Admit: 2012-12-15 | Discharge: 2012-12-15 | Disposition: A | Discharge status: Home / Self Care | Payer: Medicaid Other | Source: Ambulatory Visit | Attending: Obstetrics & Gynecology | Admitting: Obstetrics & Gynecology

## 2012-12-15 ENCOUNTER — Encounter: Payer: Self-pay | Admitting: Obstetrics & Gynecology

## 2012-12-15 ENCOUNTER — Encounter (INDEPENDENT_AMBULATORY_CARE_PROVIDER_SITE_OTHER): Payer: Self-pay

## 2012-12-15 VITALS — BP 130/80 | Ht 68.0 in | Wt 327.0 lb

## 2012-12-15 DIAGNOSIS — Z01419 Encounter for gynecological examination (general) (routine) without abnormal findings: Secondary | ICD-10-CM

## 2012-12-15 DIAGNOSIS — I10 Essential (primary) hypertension: Secondary | ICD-10-CM | POA: Insufficient documentation

## 2012-12-15 DIAGNOSIS — J45909 Unspecified asthma, uncomplicated: Secondary | ICD-10-CM | POA: Insufficient documentation

## 2012-12-15 DIAGNOSIS — Z0000 Encounter for general adult medical examination without abnormal findings: Secondary | ICD-10-CM

## 2012-12-15 DIAGNOSIS — E119 Type 2 diabetes mellitus without complications: Secondary | ICD-10-CM | POA: Insufficient documentation

## 2012-12-15 NOTE — Progress Notes (Signed)
Patient ID: [name], female   DOB: 27-Jan-1981, 32 y.o.   MRN: [id number] Subjective:     [name] is a 32 y.o. female here for a routine exam.  Patient's last menstrual period was 11/20/2012. No obstetric history on file. Current complaints: none.  .   Gynecologic History Patient's last menstrual period was 11/20/2012. Contraception: Nexplanon Last Pap: 2010. Results were: normal Last mammogram: na. Results were: na  Past Medical History  Diagnosis Date  . Diabetes mellitus   . Hypertension   . Asthma     Past Surgical History  Procedure Laterality Date  . Cesarean section      OB History   Grav Para Term Preterm Abortions TAB SAB Ect Mult Living                  History   Social History  . Marital Status: Single    Spouse Name: N/A    Number of Children: N/A  . Years of Education: N/A   Social History Main Topics  . Smoking status: Never Smoker   . Smokeless tobacco: None  . Alcohol Use: No  . Drug Use: None  . Sexual Activity: None   Other Topics Concern  . None   Social History Narrative  . None    Family History  Problem Relation Age of Onset  . Diabetes Mother   . Diabetes Father      Review of Systems  Review of Systems  Constitutional: Negative for fever, chills, weight loss, malaise/fatigue and diaphoresis.  HENT: Negative for hearing loss, ear pain, nosebleeds, congestion, sore throat, neck pain, tinnitus and ear discharge.   Eyes: Negative for blurred vision, double vision, photophobia, pain, discharge and redness.  Respiratory: Negative for cough, hemoptysis, sputum production, shortness of breath, wheezing and stridor.   Cardiovascular: Negative for chest pain, palpitations, orthopnea, claudication, leg swelling and PND.  Gastrointestinal: negative for abdominal pain. Negative for heartburn, nausea, vomiting, diarrhea, constipation, blood in stool and melena.  Genitourinary: Negative for dysuria, urgency, frequency,  hematuria and flank pain.  Musculoskeletal: Negative for myalgias, back pain, joint pain and falls.  Skin: Negative for itching and rash.  Neurological: Negative for dizziness, tingling, tremors, sensory change, speech change, focal weakness, seizures, loss of consciousness, weakness and headaches.  Endo/Heme/Allergies: Negative for environmental allergies and polydipsia. Does not bruise/bleed easily.  Psychiatric/Behavioral: Negative for depression, suicidal ideas, hallucinations, memory loss and substance abuse. The patient is not nervous/anxious and does not have insomnia.        Objective:    Physical Exam  Vitals reviewed. Constitutional: She is oriented to person, place, and time. She appears well-developed and well-nourished.  HENT:  Head: Normocephalic and atraumatic.        Right Ear: External ear normal.  Left Ear: External ear normal.  Nose: Nose normal.  Mouth/Throat: Oropharynx is clear and moist.  Eyes: Conjunctivae and EOM are normal. Pupils are equal, round, and reactive to light. Right eye exhibits no discharge. Left eye exhibits no discharge. No scleral icterus.  Neck: Normal range of motion. Neck supple. No tracheal deviation present. No thyromegaly present.  Cardiovascular: Normal rate, regular rhythm, normal heart sounds and intact distal pulses.  Exam reveals no gallop and no friction rub.   No murmur heard. Respiratory: Effort normal and breath sounds normal. No respiratory distress. She has no wheezes. She has no rales. She exhibits no tenderness.  GI: Soft. Bowel sounds are normal. She exhibits no distension and no  mass. There is no tenderness. There is no rebound and no guarding.  Genitourinary:  Breasts no masses skin changes or nipple changes bilaterally      Vulva is normal without lesions Vagina is pink moist without discharge Cervix normal in appearance and pap is done Uterus is normal size shape and contour Adnexa is negative with normal sized ovaries     Musculoskeletal: Normal range of motion. She exhibits no edema and no tenderness.  Neurological: She is alert and oriented to person, place, and time. She has normal reflexes. She displays normal reflexes. No cranial nerve deficit. She exhibits normal muscle tone. Coordination normal.  Skin: Skin is warm and dry. No rash noted. No erythema. No pallor.  Psychiatric: She has a normal mood and affect. Her behavior is normal. Judgment and thought content normal.       Assessment:    Healthy female exam.    Plan:    Contraception: Nexplanon.   Follow up for removal and replacement

## 2012-12-22 ENCOUNTER — Other Ambulatory Visit: Payer: Self-pay

## 2013-01-03 ENCOUNTER — Encounter: Payer: Self-pay | Admitting: Adult Health

## 2013-01-11 ENCOUNTER — Encounter: Payer: Self-pay | Admitting: Adult Health

## 2013-01-20 ENCOUNTER — Encounter: Payer: Self-pay | Admitting: Obstetrics and Gynecology

## 2013-01-20 ENCOUNTER — Ambulatory Visit (INDEPENDENT_AMBULATORY_CARE_PROVIDER_SITE_OTHER): Payer: Medicaid Other | Admitting: Obstetrics and Gynecology

## 2013-01-20 VITALS — BP 166/100 | Ht 68.0 in | Wt 333.0 lb

## 2013-01-20 DIAGNOSIS — Z309 Encounter for contraceptive management, unspecified: Secondary | ICD-10-CM

## 2013-01-20 DIAGNOSIS — Z3046 Encounter for surveillance of implantable subdermal contraceptive: Secondary | ICD-10-CM

## 2013-01-20 NOTE — Progress Notes (Signed)
Patient ID: [name], female   DOB: December 10, 1980, 32 y.o.   MRN: [id number] Pt here today to have Implanon removed. Pt does not want another BC at this time.    [facility] Visit  Patient name: [name] MRN [id number]  Date of birth: [birth year]  CC & HPI:  [name] is a 32 y.o. female presenting today for nexplanon removal from LEFT arm  ROS:    Pertinent History Reviewed:  Medical & Surgical Hx:  Reviewed: Significant for obesity Medications: Reviewed & Updated - see associated section Social History: Reviewed -  reports that she has never smoked. She has never used smokeless tobacco.  Objective Findings:  Vitals: BP 166/100  Ht 5\' 8"  (1.727 m)  Wt 333 lb (151.048 kg)  BMI 50.64 kg/m2  Physical Examination: General appearance - alert, well appearing, and in no distress and overweight Extremities - left arm inspected device located. GYNECOLOGY CLINIC PROCEDURE NOTE  Nexplanon Removal Patient was given informed consent for removal of her Nexplanon.  Appropriate time out taken. Nexplanon site identified.  Area prepped in usual sterile fashon. 3 ml of 1% lidocaine was used to anesthetize the area at the distal end of the implant. A small stab incision was made right beside the implant on the distal portion.  The Nexplanon rod was grasped using hemostats and removed without difficulty.  There was minimal blood loss. There were no complications.  A small amount of antibiotic ointment and steri-strips were applied over the small incision.  A pressure bandage was applied to reduce any bruising.  The patient tolerated the procedure well and was given post procedure instructions.   Assessment & Plan:   nexplanon removal w/o difficulty

## 2013-01-20 NOTE — Patient Instructions (Signed)
Keep dressing x 12 hours, may leave steristrips on x 3-5 days.

## 2013-11-01 ENCOUNTER — Ambulatory Visit: Payer: Medicaid Other | Admitting: Women's Health

## 2013-11-02 ENCOUNTER — Ambulatory Visit: Payer: Medicaid Other | Admitting: Advanced Practice Midwife

## 2013-12-01 ENCOUNTER — Other Ambulatory Visit: Payer: Self-pay

## 2013-12-18 ENCOUNTER — Encounter: Payer: Self-pay | Admitting: Obstetrics and Gynecology

## 2016-12-07 DIAGNOSIS — H5213 Myopia, bilateral: Secondary | ICD-10-CM | POA: Diagnosis not present

## 2016-12-07 DIAGNOSIS — H52223 Regular astigmatism, bilateral: Secondary | ICD-10-CM | POA: Diagnosis not present

## 2016-12-24 DIAGNOSIS — E559 Vitamin D deficiency, unspecified: Secondary | ICD-10-CM | POA: Diagnosis not present

## 2016-12-24 DIAGNOSIS — E0789 Other specified disorders of thyroid: Secondary | ICD-10-CM | POA: Diagnosis not present

## 2016-12-24 DIAGNOSIS — Z131 Encounter for screening for diabetes mellitus: Secondary | ICD-10-CM | POA: Diagnosis not present

## 2016-12-24 DIAGNOSIS — E139 Other specified diabetes mellitus without complications: Secondary | ICD-10-CM | POA: Diagnosis not present

## 2016-12-24 DIAGNOSIS — I10 Essential (primary) hypertension: Secondary | ICD-10-CM | POA: Diagnosis not present

## 2016-12-24 DIAGNOSIS — Z1321 Encounter for screening for nutritional disorder: Secondary | ICD-10-CM | POA: Diagnosis not present

## 2016-12-24 DIAGNOSIS — Z136 Encounter for screening for cardiovascular disorders: Secondary | ICD-10-CM | POA: Diagnosis not present

## 2017-03-12 MED FILL — LANTUS SOLOSTAR 100 UNITS/M: 100 | 30 days supply | Qty: 15 | Fill #0

## 2017-03-12 MED FILL — LISINOPRIL-HCTZ 20-12.5 MG: 20-12.5 | 30 days supply | Qty: 30 | Fill #0

## 2017-03-12 MED FILL — metFORMIN HCL 500 MG TABS: 500 | 30 days supply | Qty: 60 | Fill #0

## 2017-03-16 ENCOUNTER — Ambulatory Visit (INDEPENDENT_AMBULATORY_CARE_PROVIDER_SITE_OTHER): Payer: No Typology Code available for payment source | Admitting: Family Medicine

## 2017-03-16 ENCOUNTER — Encounter: Payer: Self-pay | Admitting: Family Medicine

## 2017-03-16 ENCOUNTER — Other Ambulatory Visit: Payer: Self-pay

## 2017-03-16 VITALS — BP 140/90 | HR 111 | Temp 97.0°F | Resp 16 | Ht 68.0 in | Wt 316.0 lb

## 2017-03-16 DIAGNOSIS — I10 Essential (primary) hypertension: Secondary | ICD-10-CM | POA: Diagnosis not present

## 2017-03-16 DIAGNOSIS — J4520 Mild intermittent asthma, uncomplicated: Secondary | ICD-10-CM | POA: Diagnosis not present

## 2017-03-16 DIAGNOSIS — E559 Vitamin D deficiency, unspecified: Secondary | ICD-10-CM | POA: Diagnosis not present

## 2017-03-16 DIAGNOSIS — R5383 Other fatigue: Secondary | ICD-10-CM

## 2017-03-16 DIAGNOSIS — Z113 Encounter for screening for infections with a predominantly sexual mode of transmission: Secondary | ICD-10-CM

## 2017-03-16 DIAGNOSIS — E119 Type 2 diabetes mellitus without complications: Secondary | ICD-10-CM | POA: Diagnosis not present

## 2017-03-16 DIAGNOSIS — Z114 Encounter for screening for human immunodeficiency virus [HIV]: Secondary | ICD-10-CM

## 2017-03-16 DIAGNOSIS — Z23 Encounter for immunization: Secondary | ICD-10-CM | POA: Diagnosis not present

## 2017-03-16 MED ORDER — LISINOPRIL 20 MG PO TABS: 20.0000 mg | ORAL_TABLET | Freq: Every day | ORAL | 3 refills | Status: DC

## 2017-03-16 MED ORDER — INSULIN GLARGINE 100 UNIT/ML ~~LOC~~ SOLN: 42.0000 [IU] | Freq: Every day | SUBCUTANEOUS | 6 refills | Status: DC

## 2017-03-16 MED ORDER — ALBUTEROL SULFATE HFA 108 (90 BASE) MCG/ACT IN AERS: 2.0000 | INHALATION_SPRAY | Freq: Four times a day (QID) | RESPIRATORY_TRACT | 0 refills | Status: DC | PRN

## 2017-03-16 MED ORDER — METFORMIN HCL 500 MG PO TABS: ORAL_TABLET | ORAL | 0 refills | Status: DC

## 2017-03-16 MED FILL — VENTOLIN HFA 90 MCG INHALER: 108 (90 BAS | 25 days supply | Qty: 18 | Fill #0

## 2017-03-16 NOTE — Patient Instructions (Signed)

## 2017-03-16 NOTE — Progress Notes (Signed)
Patient ID: [name], female    DOB: January 04, 1981, 37 y.o.   MRN: [id number]  Chief Complaint  Patient presents with  . Hypertension  . Diabetes    Allergies Patient has no known allergies.  Subjective:   [name] is a 37 y.o. female who presents to [facility] today.  HPI [name] is a pleasant young female who presents today to establish care.  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  She lives with [name] mother after moving in with [name] parents to help assist with [name] father's medical needs.  [name] father has subsequently passed away and she continues to live with [name] mother.  She has never been married and is not currently dating.  She reports that she has been diabetic since she was pregnant with [name] son.  She initially was nice with gestational diabetes but remained diabetic afterwards.  She has only been on metformin and Lantus insulin.  She does check [name] blood sugars but does not have a record of them.  She reports also reports that she started a food diary but did not bring that in today.  She denies any hypoglycemic episodes.  Denies any lesions on [name] feet.  Has had [name] vision checked on a yearly basis.  No history of diabetic retinopathy.  She denies any numbness or tingling in [name] feet.  Has never had a microalbumin checked to screen for any proteinuria.  She denies any kidney problems that she knows of.  Has never had a heart attack or stroke.  She does report that she has had elevated blood pressure for quite some time.  Has been on lisinopril 10 mg a day but reports that [name] blood pressure always runs high.  She denies any headaches, palpitations, shortness of breath, or swelling in [name] extremities.  She denies any cough or side effects with the lisinopril.  She does not take an aspirin on a daily basis at this time.  She reports she has never been on a statin medication for high  cholesterol or for cardioprotective benefits.  She is unsure of what [name] cholesterol panel looks like.  She denies any myalgias.  She reports that she does exercise by walking on the treadmill at work during [name] breaks and she also did recently get a treadmill at home.  She does walk for 30 minutes at least 5 days a week.  She is motivated to lose weight.  She reports that she has already had some weight loss.  She has never been to diabetic education but is interested in learning more about ways to improve [name] diabetes.  Upon questioning, she would like to be screened for any sexually transmitted diseases.  She reports that she has not been sexually active in over a year.  She denies any vaginal discharge.  Reports that [name] menses are regular. Reports that she has been vitamin D deficient in the past and uses over-the-counter vitamin D supplementation each day.  She has never been placed on prescription vitamin D.  She does report that [name] energy can be low at times.  She denies any problems with [name] mood.  She is not depressed or sad.  She sleeps well.  She does report that she has a history of asthma since she was a child.  She has not been hospitalized since she was a child for asthma.  She has never been placed on a breathing machine.  She has not had a history of pneumonia.  She reports that she does need a refill of [name] inhaler.  She likes to have one around in case of emergency.  She does not have to use [name] inhaler on a routine basis.  However, she does report that if she gets an upper respiratory infection that she might have some episodic wheezing.  She denies any shortness of breath.  No cough.  Has not had any recent infections.    Past Medical History:  Diagnosis Date  . Asthma   . Diabetes mellitus   . Hypertension     Past Surgical History:  Procedure Laterality Date  . CESAREAN SECTION    . WISDOM TOOTH EXTRACTION      Family History  Problem Relation Age of Onset  .  Diabetes Mother   . Diabetes Father   . Cancer Father      Social History   Socioeconomic History  . Marital status: Single    Spouse name: None  . Number of children: None  . Years of education: None  . Highest education level: None  Social Needs  . Financial resource strain: None  . Food insecurity - worry: None  . Food insecurity - inability: None  . Transportation needs - medical: None  . Transportation needs - non-medical: None  Occupational History  . None  Tobacco Use  . Smoking status: Never Smoker  . Smokeless tobacco: Never Used  Substance and Sexual Activity  . Alcohol use: No    Comment: every now and then  . Drug use: No  . Sexual activity: Not Currently    Partners: Male    Birth control/protection: None  Other Topics Concern  . None  Social History Narrative   Lives in [place], [place].   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    Exercises.   Single. Has one son, 20 year old.   Eats all food groups.   Wears seat belt.   Enjoys reading.   Attends church.   Lives with mom.    Has siblings, older brother.    Current Outpatient Medications on File Prior to Visit  Medication Sig Dispense Refill  . albuterol (PROVENTIL) (2.5 MG/3ML) 0.083% nebulizer solution Take 2.5 mg by nebulization every 6 (six) hours as needed.      . cetirizine (ZYRTEC) 10 MG tablet Take 10 mg by mouth daily.     No current facility-administered medications on file prior to visit.     Review of Systems  Constitutional: Negative for activity change, appetite change and fever.  HENT: Negative for nosebleeds.   Eyes: Negative for visual disturbance.  Respiratory: Negative for cough, chest tightness and shortness of breath.   Cardiovascular: Negative for chest pain, palpitations and leg swelling.  Gastrointestinal: Negative for abdominal pain, constipation, nausea and vomiting.  Endocrine: Negative for polyphagia and polyuria.  Genitourinary: Negative for dysuria, frequency and  urgency.  Musculoskeletal: Negative for arthralgias and myalgias.  Skin: Negative for rash.  Neurological: Negative for dizziness, syncope and light-headedness.  Hematological: Negative for adenopathy. Does not bruise/bleed easily.  Psychiatric/Behavioral: Negative for dysphoric mood and sleep disturbance. The patient is not nervous/anxious.      Objective:   BP 140/90 (BP Location: Left Arm, Patient Position: Sitting, Cuff Size: Normal)   Pulse (!) 111   Temp (!) 97 F (36.1 C) (Temporal)   Resp 16   Ht 5\' 8"  (1.727  m)   Wt (!) 316 lb (143.3 kg)   SpO2 99%   BMI 48.05 kg/m   Physical Exam  Constitutional: She is oriented to person, place, and time. She appears well-developed and well-nourished.  HENT:  Head: Normocephalic and atraumatic.  Eyes: Conjunctivae and EOM are normal. Pupils are equal, round, and reactive to light.  Neck: Normal range of motion. Neck supple. No JVD present. No tracheal deviation present. No thyromegaly present.  Cardiovascular: Normal rate, regular rhythm and normal heart sounds.  No murmur heard. Pulmonary/Chest: Effort normal and breath sounds normal. No respiratory distress.  Abdominal: Soft. Bowel sounds are normal. She exhibits no distension.  Musculoskeletal: Normal range of motion.  Lymphadenopathy:    She has no cervical adenopathy.  Neurological: She is alert and oriented to person, place, and time.  Skin: Skin is warm and dry. No rash noted. No erythema.  Psychiatric: She has a normal mood and affect. [name] behavior is normal. Judgment and thought content normal.  Vitals reviewed.    Assessment and Plan  1. Hypertension, unspecified type, uncontrolled  D/C lisinopril 10 mg p.o. daily.  Increase to 20 mg p.o. daily. Lifestyle modifications discussed with patient including a diet emphasizing vegetables, fruits, and whole grains. Limiting intake of sodium to less than 2,400 mg per day.  Recommendations discussed include consuming low-fat  dairy products, poultry, fish, legumes, non-tropical vegetable oils, and nuts; and limiting intake of sweets, sugar-sweetened beverages, and red meat. Discussed following a plan such as the Dietary Approaches to Stop Hypertension (DASH) diet. Patient to read up on this diet.   - lisinopril (PRINIVIL,ZESTRIL) 20 MG tablet; Take 1 tablet (20 mg total) by mouth daily.  Dispense: 90 tablet; Refill: 3 - COMPLETE METABOLIC PANEL WITH GFR  2. Mild intermittent asthma without complication  Patient was counseled on inhaler use.  She was told if she has to use [name] inhaler more frequently than normal or has any breathing issues to please contact our office or seek medical help.  She voiced understanding - albuterol (PROVENTIL HFA;VENTOLIN HFA) 108 (90 Base) MCG/ACT inhaler; Inhale 2 puffs into the lungs every 6 (six) hours as needed for wheezing or shortness of breath.  Dispense: 1 Inhaler; Refill: 0  3. Diabetes mellitus without complication [facility](HCC) Patient exam up-to-date.  No history of diabetic retinopathy.  Patient currently on insulin use and metformin.  Will titrate up metformin at this time.  She will increase the metformin as directed.  She will continue the 1000 mg p.o. every morning.  Will add 500 mg p.o. every afternoon. Patient counseled in detail regarding the risks of medication. Told to call or return to clinic if develop any worrisome signs or symptoms. Patient voiced understanding.  We will plan to check BMP at follow-up.  Today we will obtain baseline labs.  She has never received diabetic education.  I do believe she would benefit from diabetic patient and weight loss instruction.  Patient is willing to attend these classes and seek assistance.  She is motivated to lose weight and improve [name] diabetic control. - HgB A1c - Lipid panel - Amb ref to Medical Nutrition Therapy-MNT - metFORMIN (GLUCOPHAGE) 500 MG tablet; Take 2 pills in the morning and 1 in the evening  Dispense: 270 tablet;  Refill: 0 - Microalbumin / creatinine urine ratio - insulin glargine (LANTUS) 100 UNIT/ML injection; Inject 0.42 mLs (42 Units total) into the skin at bedtime.  Dispense: 10 mL; Refill: 6 4. Screening for HIV (human immunodeficiency virus)  Screening requested by patient.  Currently no high risk behaviors. - HIV antibody (with reflex)   5. Immunization due She reports that she did not obtain immunity after receiving hepatitis B vaccination series.  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  We will institute vaccination series today. - Tdap vaccine greater than or equal to 7yo IM - Hepatitis A hepatitis B combined vaccine IM  6. Screen for STD (sexually transmitted disease) Labs/screening requested per patient. - RPR - Hepatitis C Antibody - Urine cytology ancillary only  7. Fatigue, unspecified type Suspect secondary to body habitus and medical conditions.  Check check for thyroid dysfunction or anemia. - CBC with Differential/Platelet - TSH  8. Vitamin D deficiency Prior history of deficiency on over-the-counter supplementation.  Check labs today. - Vitamin D (25 hydroxy)  9. Morbid obesity (HCC) The patient is asked to make an attempt to improve diet and exercise patterns to aid in medical management of this problem. We did discuss today that she needs to lose weight.  She will seek to lose 4-6 pounds prior to [name] next visit in 1 month.  She is willing to make dietary changes and to continue [name] exercise.  She is motivated.  We did discuss that she needs to lose weight to get [name] diabetes under better control.  Return in about 4 weeks (around 04/13/2017) for follow up BP. Aliene Beams, MD 03/16/2017

## 2017-03-17 LAB — COMPLETE METABOLIC PANEL WITH GFR
AG Ratio: 1.4 (calc) (ref 1.0–2.5)
ALBUMIN MSPROF: 4.2 g/dL (ref 3.6–5.1)
ALT: 17 U/L (ref 6–29)
AST: 14 U/L (ref 10–30)
Alkaline phosphatase (APISO): 69 U/L (ref 33–115)
BUN: 10 mg/dL (ref 7–25)
CALCIUM: 9.7 mg/dL (ref 8.6–10.2)
CO2: 30 mmol/L (ref 20–32)
CREATININE: 0.92 mg/dL (ref 0.50–1.10)
Chloride: 100 mmol/L (ref 98–110)
GFR, EST AFRICAN AMERICAN: 94 mL/min/{1.73_m2} (ref >=60)
GFR, EST NON AFRICAN AMERICAN: 81 mL/min/{1.73_m2} (ref >=60)
GLOBULIN: 3.1 g/dL (ref 1.9–3.7)
GLUCOSE: 199 mg/dL — AB (ref 65–99)
Potassium: 4.1 mmol/L (ref 3.5–5.3)
SODIUM: 138 mmol/L (ref 135–146)
TOTAL PROTEIN: 7.3 g/dL (ref 6.1–8.1)
Total Bilirubin: 0.6 mg/dL (ref 0.2–1.2)

## 2017-03-17 LAB — LIPID PANEL
CHOLESTEROL: 246 mg/dL — AB (ref <200)
HDL: 69 mg/dL (ref >50)
LDL Cholesterol (Calc): 155 mg/dL (calc) — ABNORMAL HIGH
Non-HDL Cholesterol (Calc): 177 mg/dL (calc) — ABNORMAL HIGH (ref <130)
Total CHOL/HDL Ratio: 3.6 (calc) (ref <5.0)
Triglycerides: 107 mg/dL (ref <150)

## 2017-03-17 LAB — CBC WITH DIFFERENTIAL/PLATELET
BASOS ABS: 32 {cells}/uL (ref 0–200)
Basophils Relative: 0.6 %
EOS ABS: 113 {cells}/uL (ref 15–500)
Eosinophils Relative: 2.1 %
HEMATOCRIT: 38.9 % (ref 35.0–45.0)
HEMOGLOBIN: 12.3 g/dL (ref 11.7–15.5)
LYMPHS ABS: 1890 {cells}/uL (ref 850–3900)
MCH: 22.9 pg — ABNORMAL LOW (ref 27.0–33.0)
MCHC: 31.6 g/dL — AB (ref 32.0–36.0)
MCV: 72.6 fL — AB (ref 80.0–100.0)
MPV: 12 fL (ref 7.5–12.5)
Monocytes Relative: 6 %
NEUTROS ABS: 3040 {cells}/uL (ref 1500–7800)
NEUTROS PCT: 56.3 %
Platelets: 234 10*3/uL (ref 140–400)
RBC: 5.36 10*6/uL — ABNORMAL HIGH (ref 3.80–5.10)
RDW: 14.1 % (ref 11.0–15.0)
Total Lymphocyte: 35 %
WBC: 5.4 10*3/uL (ref 3.8–10.8)
WBCMIX: 324 {cells}/uL (ref 200–950)

## 2017-03-17 LAB — HEMOGLOBIN A1C
HEMOGLOBIN A1C: 11.7 %{Hb} — AB (ref <5.7)
Mean Plasma Glucose: 289 (calc)
eAG (mmol/L): 16 (calc)

## 2017-03-17 LAB — MICROALBUMIN / CREATININE URINE RATIO
CREATININE, URINE: 173 mg/dL (ref 20–275)
MICROALB UR: 27.7 mg/dL
MICROALB/CREAT RATIO: 160 ug/mg{creat} — AB (ref <30)

## 2017-03-17 LAB — HEPATITIS C ANTIBODY
Hepatitis C Ab: NONREACTIVE
SIGNAL TO CUT-OFF: 0.01 (ref <1.00)

## 2017-03-17 LAB — HIV ANTIBODY (ROUTINE TESTING W REFLEX): HIV 1&2 Ab, 4th Generation: NONREACTIVE

## 2017-03-17 LAB — VITAMIN D 25 HYDROXY (VIT D DEFICIENCY, FRACTURES): Vit D, 25-Hydroxy: 20 ng/mL — ABNORMAL LOW (ref 30–100)

## 2017-03-17 LAB — TSH: TSH: 1.54 mIU/L

## 2017-03-17 LAB — RPR: RPR: NONREACTIVE

## 2017-03-19 ENCOUNTER — Telehealth: Payer: Self-pay | Admitting: Family Medicine

## 2017-03-19 DIAGNOSIS — E119 Type 2 diabetes mellitus without complications: Secondary | ICD-10-CM

## 2017-03-19 MED ORDER — GLIPIZIDE ER 5 MG PO TB24
5.0000 mg | ORAL_TABLET | Freq: Every day | ORAL | 1 refills | Status: DC
Start: 2017-03-19 — End: 2017-05-11

## 2017-03-19 MED FILL — glipiZIDE XL 5 MG TB24: 5 | 30 days supply | Qty: 30 | Fill #0

## 2017-03-19 NOTE — Telephone Encounter (Signed)
In addition to the previous note to discuss with patient, please advise her that her syphilis, HIV, hepatitis C testing is all negative.

## 2017-03-19 NOTE — Telephone Encounter (Signed)
Called patient regarding message below. No answer, left generic message for patient to return call.   

## 2017-03-19 NOTE — Telephone Encounter (Signed)
Please call patient and advise her that her blood sugars are very elevated.  Her hemoglobin A1c was 11.7%.  This puts her average blood sugar close to 300.  Her goal hemoglobin A1c is approximately 6.5%.  Has she been taking the Lantus insulin as directed each night for the past several months?  She is supposed to be on 42 units at night.  In addition, I would like her to start Glucotrol XL 5 mg, 1 pill in the morning.  She should also increase her metformin as we discussed at her visit.  It is important for me to know if she has been using her Lantus on a daily basis in order to adjust her blood sugars correctly and not cause her any problems.  Please let me know if she has been regularly using her Lantus every night.  In addition advise her to make sure she follows up in several weeks to review all her labs.  Her cholesterol is high and her vitamin D levels are low.  We will discuss all these labs at her follow-up visit.  Please make sure she has a follow-up visit in 2-3 weeks.  I will wait to find out about her insulin if she has not been taking the Lantus nightly she needs to resume the 42 units each night as directed.  If she has been I would just the Lantus accordingly.  She will need to bring in a log of her blood sugars to her visit.

## 2017-03-22 NOTE — Telephone Encounter (Signed)
Patient informed of message below, verbalized understanding. She is taking 42 units of lantus nightly.

## 2017-03-23 ENCOUNTER — Encounter: Payer: Self-pay | Admitting: Family Medicine

## 2017-03-25 ENCOUNTER — Telehealth: Payer: Self-pay | Admitting: Family Medicine

## 2017-03-25 ENCOUNTER — Encounter: Payer: Self-pay | Admitting: Family Medicine

## 2017-03-25 NOTE — Telephone Encounter (Signed)
Can you please call and find out about this meter. Is is only 14 days?  Advise her that she does not have to check it 4 times a day, just different times each day, to let us know how she is running. She can check it 2 hours after a meal, fasting in the morning, or in the evening before bed.  Tell patient that she needs to check her sugars as directed for now, twice a day,  and we are checking on the machine. In addition, please see if you can get her labs released into mychart.

## 2017-03-26 NOTE — Telephone Encounter (Signed)
Called patient regarding message below. No answer, unable to leave message.  

## 2017-03-29 NOTE — Telephone Encounter (Signed)
Called patient regarding message below. No answer, unable to leave message.  

## 2017-03-31 ENCOUNTER — Other Ambulatory Visit: Payer: Self-pay | Admitting: Family Medicine

## 2017-03-31 NOTE — Telephone Encounter (Signed)
Please call patient and confirm her dose of Lantus.  In addition, when patient was at the visit, she informed us that she was taking lisinopril 10 mg p.o. daily and that her blood pressure had never been controlled.  She did not inform me that she was taking lisinopril/HCTZ, which the pharmacy called and said she was already taking.  Please call and advise her of this.  Advise her that if she had indeed been taking the medication that the pharmacy states she had been on in the past, then I should change her dose of medication.  Also, I need to know the dose of her Lantus that she has been taking on a consistent basis.  Once I get these doses I will send in her medications.  Please explained that in order to choose the correct dose of her blood pressure medications I need to know if she has been taking a medication and was she taking the medication prior to coming into her visit.

## 2017-03-31 NOTE — Telephone Encounter (Signed)
[name], [place]  Outpatient pharmacy left message on nurse line regarding patient. She states we faxed Lantus vial, patient was formerly on [employer] pen. Can we switch to pen? Also, lisinopril was sent in, but patient was previously on lisinopril-hctz 20-12.5 and is unaware of the change. What does she need to be on? Callback# [phone]

## 2017-04-01 NOTE — Telephone Encounter (Signed)
Called patient regarding message below. No answer, unable to leave message. Number listed as home number gets busy tone, other just rings with no option to leave message.

## 2017-04-05 MED FILL — metFORMIN HCL 500 MG TABS: 500 | 90 days supply | Qty: 270 | Fill #0

## 2017-04-06 NOTE — Telephone Encounter (Signed)
Called patient regarding message below. No answer, unable to leave message.  

## 2017-04-06 NOTE — Telephone Encounter (Signed)
Please send patient a letter asking her to follow up to discuss labs and medications. Since we have not been able to get in touch with by phone. Please send mychart message too. Janine Limboachel H. Tracie HarrierHagler, MD

## 2017-04-07 NOTE — Telephone Encounter (Signed)
[facility] is calling again to confirm the CHANGE in meds... Please call them, the RX has not been filled,[phone] ([name])

## 2017-04-07 NOTE — Telephone Encounter (Signed)
I returned call to pharmacy. I let them know I have tried to reach the patient regarding the medication. I asked that I they get in contact with the patient, to have her call us.   I tried again to reach patient, on both numbers, I have been unable to reach.

## 2017-04-08 ENCOUNTER — Encounter: Payer: Self-pay | Admitting: Family Medicine

## 2017-04-09 NOTE — Telephone Encounter (Signed)
I have been unable to reach this patient by phone. I have sent her a MyChart message.

## 2017-04-09 NOTE — Telephone Encounter (Signed)
Please call patient and find out what she needs in regards to her medications.  What I was trying to find out is has she been taking all her medications as directed.  Has she been using the Lantus at 42 units each day.

## 2017-04-13 MED ORDER — LISINOPRIL 40 MG PO TABS: 40.0000 mg | ORAL_TABLET | Freq: Every day | ORAL | 3 refills | Status: DC

## 2017-04-13 MED ORDER — HYDROCHLOROTHIAZIDE 25 MG PO TABS: 12.5000 mg | ORAL_TABLET | Freq: Every day | ORAL | 3 refills | Status: DC

## 2017-04-13 MED FILL — HYDROCHLOROTHIAZIDE 25 MG T: 25 | 90 days supply | Qty: 45 | Fill #0

## 2017-04-13 MED FILL — LISINOPRIL 40 MG TABLET: 40 | 90 days supply | Qty: 90 | Fill #0

## 2017-04-13 NOTE — Telephone Encounter (Signed)
Message sent via mychart. Janine Limboachel H. Tracie HarrierHagler, MD

## 2017-04-15 ENCOUNTER — Ambulatory Visit: Payer: No Typology Code available for payment source | Admitting: Nutrition

## 2017-04-20 ENCOUNTER — Ambulatory Visit: Payer: No Typology Code available for payment source | Admitting: Family Medicine

## 2017-04-28 MED FILL — glipiZIDE XL 5 MG TB24: 5 | 30 days supply | Qty: 30 | Fill #1

## 2017-05-10 ENCOUNTER — Encounter: Payer: No Typology Code available for payment source | Attending: Family Medicine | Admitting: Nutrition

## 2017-05-10 VITALS — Ht 67.5 in | Wt 320.8 lb

## 2017-05-10 DIAGNOSIS — E1165 Type 2 diabetes mellitus with hyperglycemia: Secondary | ICD-10-CM | POA: Diagnosis not present

## 2017-05-10 DIAGNOSIS — IMO0002 Reserved for concepts with insufficient information to code with codable children: Secondary | ICD-10-CM

## 2017-05-10 DIAGNOSIS — E118 Type 2 diabetes mellitus with unspecified complications: Secondary | ICD-10-CM | POA: Diagnosis not present

## 2017-05-10 NOTE — Patient Instructions (Signed)
Goals 1. Follow My Plate 2. Increase fresh fruits and vegetables. 3. Cut out snacks, sodas and tea 4. Increase exercise Eat 2-3 carb choices per meal. Don't skip meals and cut out snacks between meals

## 2017-05-11 ENCOUNTER — Encounter: Payer: Self-pay | Admitting: Nutrition

## 2017-05-11 ENCOUNTER — Other Ambulatory Visit (HOSPITAL_COMMUNITY)
Admission: RE | Admit: 2017-05-11 | Discharge: 2017-05-11 | Disposition: A | Discharge status: Home / Self Care | Payer: No Typology Code available for payment source | Source: Ambulatory Visit | Attending: Family Medicine | Admitting: Family Medicine

## 2017-05-11 ENCOUNTER — Other Ambulatory Visit: Payer: Self-pay

## 2017-05-11 ENCOUNTER — Ambulatory Visit (INDEPENDENT_AMBULATORY_CARE_PROVIDER_SITE_OTHER): Payer: No Typology Code available for payment source | Admitting: Family Medicine

## 2017-05-11 ENCOUNTER — Encounter: Payer: Self-pay | Admitting: Family Medicine

## 2017-05-11 VITALS — BP 136/82 | HR 95 | Temp 97.5°F | Resp 16 | Ht 68.0 in | Wt 320.8 lb

## 2017-05-11 DIAGNOSIS — I10 Essential (primary) hypertension: Secondary | ICD-10-CM | POA: Diagnosis not present

## 2017-05-11 DIAGNOSIS — Z6841 Body Mass Index (BMI) 40.0 and over, adult: Secondary | ICD-10-CM | POA: Diagnosis not present

## 2017-05-11 DIAGNOSIS — E559 Vitamin D deficiency, unspecified: Secondary | ICD-10-CM | POA: Diagnosis not present

## 2017-05-11 DIAGNOSIS — E119 Type 2 diabetes mellitus without complications: Secondary | ICD-10-CM | POA: Diagnosis not present

## 2017-05-11 DIAGNOSIS — Z23 Encounter for immunization: Secondary | ICD-10-CM

## 2017-05-11 DIAGNOSIS — E782 Mixed hyperlipidemia: Secondary | ICD-10-CM | POA: Diagnosis not present

## 2017-05-11 DIAGNOSIS — Z794 Long term (current) use of insulin: Secondary | ICD-10-CM

## 2017-05-11 DIAGNOSIS — Z113 Encounter for screening for infections with a predominantly sexual mode of transmission: Secondary | ICD-10-CM

## 2017-05-11 MED ORDER — AMLODIPINE BESYLATE 5 MG PO TABS: 5.0000 mg | ORAL_TABLET | Freq: Every day | ORAL | 3 refills | Status: DC

## 2017-05-11 MED ORDER — VITAMIN D (ERGOCALCIFEROL) 1.25 MG (50000 UNIT) PO CAPS: 50000.0000 [IU] | ORAL_CAPSULE | ORAL | 0 refills | Status: DC

## 2017-05-11 MED ORDER — ATORVASTATIN CALCIUM 20 MG PO TABS: 20.0000 mg | ORAL_TABLET | Freq: Every day | ORAL | 3 refills | Status: DC

## 2017-05-11 MED ORDER — GLIPIZIDE ER 5 MG PO TB24: 5.0000 mg | ORAL_TABLET | Freq: Every day | ORAL | 1 refills | Status: DC

## 2017-05-11 MED ORDER — METFORMIN HCL 1000 MG PO TABS: 1000.0000 mg | ORAL_TABLET | Freq: Two times a day (BID) | ORAL | 3 refills | Status: DC

## 2017-05-11 MED FILL — AMLODIPINE BESYLATE 5 MG TA: 5 | 90 days supply | Qty: 90 | Fill #0

## 2017-05-11 MED FILL — VIT D2 1.25 MG (50,000 UNIT: 1.25 MG | 84 days supply | Qty: 12 | Fill #0

## 2017-05-11 MED FILL — ATORVASTATIN 20 MG TABLET: 20 | 90 days supply | Qty: 90 | Fill #0

## 2017-05-11 MED FILL — metFORMIN HCL 1000 MG TABS: 1000 | 90 days supply | Qty: 180 | Fill #0

## 2017-05-11 NOTE — Progress Notes (Signed)
Diabetes Self-Management Education  Visit Type: First/Initial  Appt. Start Time:0930 Appt. End Time: 1100  05/11/2017  [name], identified by name and date of birth, is a 37 y.o. female with a diagnosis of Diabetes: Type 2. Has had DM for 12+ years. Had GDM with her son 12 yrs ago.   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 Eats 2 meals per day.  Works third shift. Not physically active but willing to start. CHecks BS once a day. She complains of being really hungry at times even though her BS aren't low. Is taking Glipizide. Meds: Lantus 42 units a day, Glipizide once a day. Couldn't tolerated Metformin. She is motivated to manage her diabetes better. Wants to lose weight. Engaged to making lifestyle changes. Labs elevated: A1C, TG. AST/ALT, LDL Calc  BS are running 130-190's in am.  Lab Results  Component Value Date   HGBA1C 11.7 (H) 03/16/2017   CMP Latest Ref Rng & Units 03/16/2017 12/28/2010 11/17/2009  Glucose 65 - 99 mg/dL 161(W199(H) 960(A310(H) 540(J312(H)  BUN 7 - 25 mg/dL 10 15 12   Creatinine 0.50 - 1.10 mg/dL 8.110.92 9.140.94 7.820.90  Sodium 135 - 146 mmol/L 138 134(L) 131(L)  Potassium 3.5 - 5.3 mmol/L 4.1 4.2 3.3(L)  Chloride 98 - 110 mmol/L 100 100 100  CO2 20 - 32 mmol/L 30 27 21   Calcium 8.6 - 10.2 mg/dL 9.7 9.7 9.0  Total Protein 6.1 - 8.1 g/dL 7.3 7.4 7.9  Total Bilirubin 0.2 - 1.2 mg/dL 0.6 0.4 9.5(A0.2(L)  Alkaline Phos 39 - 117 U/L - 61 53  AST 10 - 30 U/L 14 282(H) 52(H)  ALT 6 - 29 U/L 17 105(H) 53(H)   Lipid Panel     Component Value Date/Time   CHOL 246 (H) 03/16/2017 1056   TRIG 107 03/16/2017 1056   HDL 69 03/16/2017 1056   CHOLHDL 3.6 03/16/2017 1056   LDLCALC 155 (H) 03/16/2017 1056       ASSESSMENT  Height 5' 7.5" (1.715 m), weight (!) 320 lb 12.8 oz (145.5 kg), last menstrual period 04/29/2017. Body mass index is 49.5 kg/m.  Diabetes Self-Management Education - 05/10/17 0948      Visit Information   Visit Type  First/Initial      Initial Visit    Diabetes Type  Type 2    Are you currently following a meal plan?  No    Are you taking your medications as prescribed?  Yes    Date Diagnosed  2007      Health Coping   How would you rate your overall health?  Good      Psychosocial Assessment   Patient Belief/Attitude about Diabetes  Motivated to manage diabetes    Self-care barriers  None    Self-management support  Family    Other persons present  Patient    Patient Concerns  Nutrition/Meal planning;Medication;Monitoring;Healthy Lifestyle;Problem Solving;Weight Control;Glycemic Control    Special Needs  None    Preferred Learning Style  No preference indicated    Learning Readiness  Ready    How often do you need to have someone help you when you read instructions, pamphlets, or other written materials from your doctor or pharmacy?  1 - Never    What is the last grade level you completed in school?  14      Pre-Education Assessment   Patient understands the diabetes disease and treatment process.  Needs Review    Patient understands incorporating nutritional management  into lifestyle.  Needs Review    Patient undertands incorporating physical activity into lifestyle.  Needs Review    Patient understands using medications safely.  Needs Review    Patient understands monitoring blood glucose, interpreting and using results  Needs Review    Patient understands prevention, detection, and treatment of acute complications.  Needs Review    Patient understands prevention, detection, and treatment of chronic complications.  Needs Review    Patient understands how to develop strategies to address psychosocial issues.  Needs Review      Complications   Last HgB A1C per patient/outside source  11.7 %    How often do you check your blood sugar?  1-2 times/day    Fasting Blood glucose range (mg/dL)  60-454    Number of hypoglycemic episodes per month  0    Number of hyperglycemic episodes per week  2    Can you tell when your blood  sugar is high?  Yes    What do you do if your blood sugar is high?  drink water and move around    Have you had a dilated eye exam in the past 12 months?  Yes    Have you had a dental exam in the past 12 months?  Yes    Are you checking your feet?  Yes    How many days per week are you checking your feet?  7      Dietary Intake   Breakfast  Fruit 2 slices bacon, biscuit and eggs, cofee and splenda    Snack (morning)  yogurt or nuts    Lunch  Leftovers, water    Dinner  2 corn dogs, water    Beverage(s)  water      Exercise   Exercise Type  Light (walking / raking leaves)    How many days per week to you exercise?  30    How many minutes per day do you exercise?  2    Total minutes per week of exercise  60      Patient Education   Disease state   Definition of diabetes, type 1 and 2, and the diagnosis of diabetes;Explored patient's options for treatment of their diabetes    Nutrition management   Role of diet in the treatment of diabetes and the relationship between the three main macronutrients and blood glucose level    Physical activity and exercise   Role of exercise on diabetes management, blood pressure control and cardiac health.;Identified with patient nutritional and/or medication changes necessary with exercise.    Medications  Reviewed patients medication for diabetes, action, purpose, timing of dose and side effects.    Monitoring  Purpose and frequency of SMBG.    Acute complications  Taught treatment of hypoglycemia - the 15 rule.    Chronic complications  Relationship between chronic complications and blood glucose control;Assessed and discussed foot care and prevention of foot problems    Psychosocial adjustment  Worked with patient to identify barriers to care and solutions;Helped patient identify a support system for diabetes management    Personal strategies to promote health  Lifestyle issues that need to be addressed for better diabetes care      Individualized  Goals (developed by patient)   Nutrition  Follow meal plan discussed;General guidelines for healthy choices and portions discussed    Physical Activity  Exercise 3-5 times per week;30 minutes per day    Medications  take my medication as prescribed  Monitoring   test my blood glucose as discussed    Reducing Risk  examine blood glucose patterns;do foot checks daily      Post-Education Assessment   Patient understands the diabetes disease and treatment process.  Needs Review    Patient understands incorporating nutritional management into lifestyle.  Needs Review    Patient undertands incorporating physical activity into lifestyle.  Needs Review    Patient understands using medications safely.  Needs Review    Patient understands monitoring blood glucose, interpreting and using results  Needs Review    Patient understands prevention, detection, and treatment of acute complications.  Needs Review    Patient understands prevention, detection, and treatment of chronic complications.  Needs Review    Patient understands how to develop strategies to address psychosocial issues.  Needs Review    Patient understands how to develop strategies to promote health/change behavior.  Needs Review      Outcomes   Expected Outcomes  Demonstrated interest in learning. Expect positive outcomes    Future DMSE  4-6 wks    Program Status  Completed       Individualized Plan for Diabetes Self-Management Training:   Learning Objective:  Patient will have a greater understanding of diabetes self-management. Patient education plan is to attend individual and/or group sessions per assessed needs and concerns.   Plan:   Patient Instructions  Goals 1. Follow My Plate 2. Increase fresh fruits and vegetables. 3. Cut out snacks, sodas and tea 4. Increase exercise Eat 2-3 carb choices per meal. Don't skip meals and cut out snacks between meals    Expected Outcomes:  Demonstrated interest in learning.  Expect positive outcomes  Education material provided: Living Well with Diabetes, A1C conversion sheet, Meal plan card, My Plate and Carbohydrate counting sheet  If problems or questions, patient to contact team via:  Phone and Email  Future DSME appointment: 4-6 wks  Would recommend to stop GLipizide as the risks outweigh benefits since she has had DM for 12+ years and may be contributing to her hunger and over eating. May consider adding a GLP1 for needed weight loss, reduced appetite and improved BS control.

## 2017-05-11 NOTE — Patient Instructions (Signed)
Follow up in 1 month for CPE or BP check.  DASH Eating Plan DASH stands for "Dietary Approaches to Stop Hypertension." The DASH eating plan is a healthy eating plan that has been shown to reduce high blood pressure (hypertension). It may also reduce your risk for type 2 diabetes, heart disease, and stroke. The DASH eating plan may also help with weight loss. What are tips for following this plan? General guidelines  Avoid eating more than 2,300 mg (milligrams) of salt (sodium) a day. If you have hypertension, you may need to reduce your sodium intake to 1,500 mg a day.  Limit alcohol intake to no more than 1 drink a day for nonpregnant women and 2 drinks a day for men. One drink equals 12 oz of beer, 5 oz of wine, or 1 oz of hard liquor.  Work with your health care provider to maintain a healthy body weight or to lose weight. Ask what an ideal weight is for you.  Get at least 30 minutes of exercise that causes your heart to beat faster (aerobic exercise) most days of the week. Activities may include walking, swimming, or biking.  Work with your health care provider or diet and nutrition specialist (dietitian) to adjust your eating plan to your individual calorie needs. Reading food labels  Check food labels for the amount of sodium per serving. Choose foods with less than 5 percent of the Daily Value of sodium. Generally, foods with less than 300 mg of sodium per serving fit into this eating plan.  To find whole grains, look for the word "whole" as the first word in the ingredient list. Shopping  Buy products labeled as "low-sodium" or "no salt added."  Buy fresh foods. Avoid canned foods and premade or frozen meals. Cooking  Avoid adding salt when cooking. Use salt-free seasonings or herbs instead of table salt or sea salt. Check with your health care provider or pharmacist before using salt substitutes.  Do not fry foods. Cook foods using healthy methods such as baking, boiling,  grilling, and broiling instead.  Cook with heart-healthy oils, such as olive, canola, soybean, or sunflower oil. Meal planning   Eat a balanced diet that includes: ? 5 or more servings of fruits and vegetables each day. At each meal, try to fill half of your plate with fruits and vegetables. ? Up to 6-8 servings of whole grains each day. ? Less than 6 oz of lean meat, poultry, or fish each day. A 3-oz serving of meat is about the same size as a deck of cards. One egg equals 1 oz. ? 2 servings of low-fat dairy each day. ? A serving of nuts, seeds, or beans 5 times each week. ? Heart-healthy fats. Healthy fats called Omega-3 fatty acids are found in foods such as flaxseeds and coldwater fish, like sardines, salmon, and mackerel.  Limit how much you eat of the following: ? Canned or prepackaged foods. ? Food that is high in trans fat, such as fried foods. ? Food that is high in saturated fat, such as fatty meat. ? Sweets, desserts, sugary drinks, and other foods with added sugar. ? Full-fat dairy products.  Do not salt foods before eating.  Try to eat at least 2 vegetarian meals each week.  Eat more home-cooked food and less restaurant, buffet, and fast food.  When eating at a restaurant, ask that your food be prepared with less salt or no salt, if possible. What foods are recommended? The items listed may  not be a complete list. Talk with your dietitian about what dietary choices are best for you. Grains Whole-grain or whole-wheat bread. Whole-grain or whole-wheat pasta. Brown rice. [place]. Bulgur. Whole-grain and low-sodium cereals. Pita bread. Low-fat, low-sodium crackers. Whole-wheat flour tortillas. Vegetables Fresh or frozen vegetables (raw, steamed, roasted, or grilled). Low-sodium or reduced-sodium tomato and vegetable juice. Low-sodium or reduced-sodium tomato sauce and tomato paste. Low-sodium or reduced-sodium canned vegetables. Fruits All fresh, dried, or frozen  fruit. Canned fruit in natural juice (without added sugar). Meat and other protein foods Skinless chicken or [place]. Ground chicken or [place]. Pork with fat trimmed off. Fish and seafood. Egg whites. Dried beans, peas, or lentils. Unsalted nuts, nut butters, and seeds. Unsalted canned beans. Lean cuts of beef with fat trimmed off. Low-sodium, lean deli meat. Dairy Low-fat (1%) or fat-free (skim) milk. Fat-free, low-fat, or reduced-fat cheeses. Nonfat, low-sodium ricotta or cottage cheese. Low-fat or nonfat yogurt. Low-fat, low-sodium cheese. Fats and oils Soft margarine without trans fats. Vegetable oil. Low-fat, reduced-fat, or light mayonnaise and salad dressings (reduced-sodium). Canola, safflower, olive, soybean, and sunflower oils. Avocado. Seasoning and other foods Herbs. Spices. Seasoning mixes without salt. Unsalted popcorn and pretzels. Fat-free sweets. What foods are not recommended? The items listed may not be a complete list. Talk with your dietitian about what dietary choices are best for you. Grains Baked goods made with fat, such as croissants, muffins, or some breads. Dry pasta or rice meal packs. Vegetables Creamed or fried vegetables. Vegetables in a cheese sauce. Regular canned vegetables (not low-sodium or reduced-sodium). Regular canned tomato sauce and paste (not low-sodium or reduced-sodium). Regular tomato and vegetable juice (not low-sodium or reduced-sodium). Angie Fava. Olives. Fruits Canned fruit in a light or heavy syrup. Fried fruit. Fruit in cream or butter sauce. Meat and other protein foods Fatty cuts of meat. Ribs. Fried meat. Berniece Salines. Sausage. Bologna and other processed lunch meats. Salami. Fatback. Hotdogs. Bratwurst. Salted nuts and seeds. Canned beans with added salt. Canned or smoked fish. Whole eggs or egg yolks. Chicken or [place] with skin. Dairy Whole or 2% milk, cream, and half-and-half. Whole or full-fat cream cheese. Whole-fat or sweetened yogurt. Full-fat  cheese. Nondairy creamers. Whipped toppings. Processed cheese and cheese spreads. Fats and oils Butter. Stick margarine. Lard. Shortening. Ghee. Bacon fat. Tropical oils, such as coconut, palm kernel, or palm oil. Seasoning and other foods Salted popcorn and pretzels. Onion salt, garlic salt, seasoned salt, table salt, and sea salt. Worcestershire sauce. Tartar sauce. Barbecue sauce. Teriyaki sauce. Soy sauce, including reduced-sodium. Steak sauce. Canned and packaged gravies. Fish sauce. Oyster sauce. Cocktail sauce. Horseradish that you find on the shelf. Ketchup. Mustard. Meat flavorings and tenderizers. Bouillon cubes. Hot sauce and Tabasco sauce. Premade or packaged marinades. Premade or packaged taco seasonings. Relishes. Regular salad dressings. Where to find more information:  National Heart, Lung, and [place]: https://wilson-eaton.com/  American Heart Association: www.heart.org Summary  The DASH eating plan is a healthy eating plan that has been shown to reduce high blood pressure (hypertension). It may also reduce your risk for type 2 diabetes, heart disease, and stroke.  With the DASH eating plan, you should limit salt (sodium) intake to 2,300 mg a day. If you have hypertension, you may need to reduce your sodium intake to 1,500 mg a day.  When on the DASH eating plan, aim to eat more fresh fruits and vegetables, whole grains, lean proteins, low-fat dairy, and heart-healthy fats.  Work with your health care provider or diet and  nutrition specialist (dietitian) to adjust your eating plan to your individual calorie needs. This information is not intended to replace advice given to you by your health care provider. Make sure you discuss any questions you have with your health care provider. Document Released: 01/22/2011 Document Revised: 01/27/2016 Document Reviewed: 01/27/2016 Elsevier Interactive Patient Education  [employer].

## 2017-05-11 NOTE — Progress Notes (Signed)
Patient ID: [name], female    DOB: 05-10-1980, 37 y.o.   MRN: [id number]  Chief Complaint  Patient presents with  . Hypertension    Allergies Patient has no known allergies.  Subjective:   [name] is a 37 y.o. female who presents to [facility] today.  HPI [name] presents today for follow-up visit.  She has been taking lisinopril 40 mg daily and HCTZ 25 mg p.o. daily.  She reports her blood pressure is still running in the 140s over 80s-90s at home.  She denies any side effects with the medication.  She reports her energy level can still be low at times.  However, she is feeling better.  She denies any chest pain, shortness of breath, or swelling in her extremities.  She has no palpitations.  She is still exercising by walking on the treadmill 3 times a week.  She has been using her diabetic medications as directed.  Denies any hypoglycemic episodes.  Can run from the 90s-140s when fasting.  Has been using the 42 units of Lantus insulin nightly.  Is taking a total of 1500 mg of metformin a day.  Is also taking the Glucotrol XL 5 mg a day.  When her last A1c was done at the end of January she had been off of some of her medications.  She denies any hypoglycemic episodes.  She still does have high blood sugars.  She is not always compliant with her diet.  She reports she still gets food cravings around her menses.  She denies any lesions on her feet.  Her eye exam is up-to-date.  Believes she had pneumonia shot several years ago at a [facility].  Denies any numbness or tingling in her feet.  Is here to discuss her cholesterol.  Is not on cholesterol medication at this time.  Her LDL was elevated in the 150s.  She has never been on a statin.  She denies any myalgias.  Her vitamin D levels were low.  She was taking vitamin D 2000 IUs a day.  She has not been on any prescription vitamin D.  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  She reports her  mood is good.  She is not sexually active.  Menses are regular.  She has not lost weight from her last visit.  Instead, she has gained a few pounds.   Past Medical History:  Diagnosis Date  . Asthma   . Diabetes mellitus   . Hypertension     Past Surgical History:  Procedure Laterality Date  . CESAREAN SECTION    . WISDOM TOOTH EXTRACTION      Family History  Problem Relation Age of Onset  . Diabetes Mother   . Diabetes Father   . Cancer Father      Social History   Socioeconomic History  . Marital status: Single    Spouse name: Not on file  . Number of children: Not on file  . Years of education: Not on file  . Highest education level: Not on file  Occupational History  . Not on file  Social Needs  . Financial resource strain: Not on file  . Food insecurity:    Worry: Not on file    Inability: Not on file  . Transportation needs:    Medical: Not on file    Non-medical: Not on file  Tobacco Use  . Smoking status: Never Smoker  . Smokeless  tobacco: Never Used  Substance and Sexual Activity  . Alcohol use: No    Comment: every now and then  . Drug use: No  . Sexual activity: Not Currently    Partners: Male    Birth control/protection: None  Lifestyle  . Physical activity:    Days per week: Not on file    Minutes per session: Not on file  . Stress: Not on file  Relationships  . Social connections:    Talks on phone: Not on file    Gets together: Not on file    Attends religious service: Not on file    Active member of club or organization: Not on file    Attends meetings of clubs or organizations: Not on file    Relationship status: Not on file  Other Topics Concern  . Not on file  Social History Narrative   Lives in [place], [place].   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    Exercises.   Single. Has one son, 66 year old.   Eats all food groups.   Wears seat belt.   Enjoys reading.   Attends church.   Lives with mom.    Has siblings, older  brother.    Current Outpatient Medications on File Prior to Visit  Medication Sig Dispense Refill  . albuterol (PROVENTIL HFA;VENTOLIN HFA) 108 (90 Base) MCG/ACT inhaler Inhale 2 puffs into the lungs every 6 (six) hours as needed for wheezing or shortness of breath. 1 Inhaler 0  . albuterol (PROVENTIL) (2.5 MG/3ML) 0.083% nebulizer solution Take 2.5 mg by nebulization every 6 (six) hours as needed.      . cetirizine (ZYRTEC) 10 MG tablet Take 10 mg by mouth daily.    . hydrochlorothiazide (HYDRODIURIL) 25 MG tablet Take 0.5 tablets (12.5 mg total) by mouth daily. 45 tablet 3  . insulin glargine (LANTUS) 100 UNIT/ML injection Inject 0.42 mLs (42 Units total) into the skin at bedtime. 10 mL 6  . lisinopril (PRINIVIL,ZESTRIL) 40 MG tablet Take 1 tablet (40 mg total) by mouth daily. 90 tablet 3   No current facility-administered medications on file prior to visit.     Review of Systems  Constitutional: Negative for activity change, appetite change and fever.  HENT: Negative for dental problem.   Eyes: Negative for visual disturbance.  Respiratory: Negative for cough, chest tightness and shortness of breath.   Cardiovascular: Negative for chest pain, palpitations and leg swelling.  Gastrointestinal: Negative for abdominal pain, nausea and vomiting.  Genitourinary: Negative for dysuria, frequency, menstrual problem, urgency, vaginal bleeding, vaginal discharge and vaginal pain.  Musculoskeletal: Negative for myalgias.  Neurological: Negative for dizziness, syncope and light-headedness.  Hematological: Negative for adenopathy.  Psychiatric/Behavioral: Negative for dysphoric mood and sleep disturbance. The patient is not nervous/anxious.      Objective:   BP 136/82 (BP Location: Left Arm, Patient Position: Sitting, Cuff Size: Normal)   Pulse 95   Temp (!) 97.5 F (36.4 C) (Temporal)   Resp 16   Ht 5\' 8"  (1.727 m)   Wt (!) 320 lb 12 oz (145.5 kg)   LMP 04/29/2017 (Approximate)   SpO2  99%   BMI 48.77 kg/m   Physical Exam  Constitutional: She is oriented to person, place, and time. She appears well-developed and well-nourished. No distress.  HENT:  Head: Normocephalic and atraumatic.  Eyes: Pupils are equal, round, and reactive to light. No scleral icterus.  Neck: Normal range of motion. Neck supple.  Cardiovascular: Normal rate, regular  rhythm and normal heart sounds.  Pulses:      Dorsalis pedis pulses are 2+ on the right side, and 2+ on the left side.       Posterior tibial pulses are 2+ on the right side, and 2+ on the left side.  Pulmonary/Chest: Effort normal and breath sounds normal. No respiratory distress.  Feet:  Right Foot:  Protective Sensation: 8 sites tested. 8 sites sensed.  Skin Integrity: Negative for ulcer, blister, skin breakdown, erythema, warmth or callus.  Left Foot:  Protective Sensation: 8 sites tested. 8 sites sensed.  Skin Integrity: Negative for ulcer, blister, skin breakdown, erythema, warmth or callus.  Neurological: She is alert and oriented to person, place, and time. No cranial nerve deficit.  Skin: Skin is warm and dry.  Psychiatric: She has a normal mood and affect. Judgment and thought content normal.  Nursing note and vitals reviewed.    Assessment and Plan  1. Essential hypertension Blood pressure goal discussed with patient today.  Goal is less than 130/80.  Add Norvasc at this time.Patient counseled in detail regarding the risks of medication. Told to call or return to clinic if develop any worrisome signs or symptoms. Patient voiced understanding.  Lifestyle modifications discussed with patient including a diet emphasizing vegetables, fruits, and whole grains. Limiting intake of sodium to less than 2,400 mg per day.  Recommendations discussed include consuming low-fat dairy products, poultry, fish, legumes, non-tropical vegetable oils, and nuts; and limiting intake of sweets, sugar-sweetened beverages, and red meat. Discussed  following a plan such as the Dietary Approaches to Stop Hypertension (DASH) diet. Patient to read up on this diet.   - amLODipine (NORVASC) 5 MG tablet; Take 1 tablet (5 mg total) by mouth daily.  Dispense: 90 tablet; Refill: 3 - Basic metabolic panel  2. Diabetes mellitus type 2, insulin dependent (HCC) We will increase metformin today to 1000 mg twice a day.  Foot exam performed.  Pneumonia 13 vaccine given.  Continue visits with nutritionist.  Plan to recheck hemoglobin A1c in 3 months. - glipiZIDE (GLUCOTROL XL) 5 MG 24 hr tablet; Take 1 tablet (5 mg total) by mouth daily with breakfast.  Dispense: 90 tablet; Refill: 1 - metFORMIN (GLUCOPHAGE) 1000 MG tablet; Take 1 tablet (1,000 mg total) by mouth 2 (two) times daily with a meal.  Dispense: 180 tablet; Refill: 3 - Pneumococcal conjugate vaccine 13-valent  3. Mixed hyperlipidemia Hyperlipidemia and the associated risk of ASCVD were discussed today. Primary vs. Secondary prevention of ASCVD were discussed and how it relates to patient morbidity, mortality, and quality of life. Shared decision making with patient including the risks of statins vs.benefits of ASCVD risk reduction discussed.  Risks of stains discussed including myopathy, rhabdomyoloysis, liver problems, increased risk of diabetes discussed. We discussed heart healthy diet, lifestyle modifications, risk factor modifications, and adherence to the recommended treatment plan. We discussed the need to periodically monitor lipid panel and liver function tests while on statin therapy.   - atorvastatin (LIPITOR) 20 MG tablet; Take 1 tablet (20 mg total) by mouth daily.  Dispense: 90 tablet; Refill: 3  4. Vitamin D deficiency Hold over-the-counter vitamin D supplementation at this time.  Start prescription vitamin D weekly for the next 3 months.   - Vitamin D, Ergocalciferol, (DRISDOL) 50000 units CAPS capsule; Take 1 capsule (50,000 Units total) by mouth every 7 (seven) days.  Dispense: 12  capsule; Refill: 0  5. Screen for STD (sexually transmitted disease) At last visit hepatitis panel, syphilis,  and HIV were checked.  She had failed to leave a urine specimen for the cytology.  She requests this to be done today. - Urine cytology ancillary only 6. Obesity Class 3 severe obesity due to excess calories with serious comorbidity and body mass index (BMI) of 45.0 to 49.9 in adult [facility]) The patient is asked to make an attempt to improve diet and exercise patterns to aid in medical management of this problem. Continue to discuss at subsequent visits.  Patient encouraged regarding weight loss.  We discussed how her current weight affects her overall health including her diabetes and her blood pressure. Return in about 1 month (around 06/08/2017) for Blood pressure check. Aliene Beams, MD 05/11/2017

## 2017-05-12 LAB — URINE CYTOLOGY ANCILLARY ONLY
Chlamydia: NEGATIVE
NEISSERIA GONORRHEA: NEGATIVE
TRICH (WINDOWPATH): NEGATIVE

## 2017-05-14 ENCOUNTER — Encounter: Payer: Self-pay | Admitting: Family Medicine

## 2017-06-03 MED FILL — glipiZIDE XL 5 MG TB24: 5 | 90 days supply | Qty: 90 | Fill #0

## 2017-06-10 ENCOUNTER — Ambulatory Visit: Payer: No Typology Code available for payment source | Admitting: Nutrition

## 2017-06-15 ENCOUNTER — Telehealth: Payer: Self-pay | Admitting: Family Medicine

## 2017-06-15 NOTE — Telephone Encounter (Signed)
Patient lvm to canc appt & r/s, I called patient back to discuss no answer, full voicemail.

## 2017-06-17 ENCOUNTER — Ambulatory Visit: Payer: No Typology Code available for payment source | Admitting: Family Medicine

## 2017-07-15 MED FILL — LISINOPRIL 40 MG TABLET: 40 | 90 days supply | Qty: 90 | Fill #1

## 2017-07-15 MED FILL — HYDROCHLOROTHIAZIDE 25 MG T: 25 | 90 days supply | Qty: 45 | Fill #1

## 2017-07-23 ENCOUNTER — Encounter: Payer: Self-pay | Admitting: Family Medicine

## 2017-08-10 MED FILL — AMLODIPINE BESYLATE 5 MG TA: 5 | 90 days supply | Qty: 90 | Fill #1

## 2017-08-10 MED FILL — ATORVASTATIN 20 MG TABLET: 20 | 90 days supply | Qty: 90 | Fill #1

## 2017-08-10 MED FILL — metFORMIN HCL 1000 MG TABS: 1000 | 90 days supply | Qty: 180 | Fill #1

## 2017-09-01 MED FILL — glipiZIDE XL 5 MG TB24: 5 | 90 days supply | Qty: 90 | Fill #1

## 2017-10-11 MED FILL — HYDROCHLOROTHIAZIDE 25 MG T: 25 | 90 days supply | Qty: 45 | Fill #2

## 2017-10-11 MED FILL — LISINOPRIL 40 MG TABLET: 40 | 90 days supply | Qty: 90 | Fill #2

## 2017-11-01 ENCOUNTER — Other Ambulatory Visit: Payer: Self-pay

## 2017-11-01 ENCOUNTER — Encounter (HOSPITAL_COMMUNITY): Payer: Self-pay | Admitting: Emergency Medicine

## 2017-11-01 ENCOUNTER — Emergency Department (HOSPITAL_COMMUNITY)
Admission: EM | Admit: 2017-11-01 | Discharge: 2017-11-01 | Disposition: A | Discharge status: Home / Self Care | Payer: No Typology Code available for payment source | Attending: Emergency Medicine | Admitting: Emergency Medicine

## 2017-11-01 DIAGNOSIS — J45909 Unspecified asthma, uncomplicated: Secondary | ICD-10-CM | POA: Insufficient documentation

## 2017-11-01 DIAGNOSIS — Y9389 Activity, other specified: Secondary | ICD-10-CM | POA: Diagnosis not present

## 2017-11-01 DIAGNOSIS — Z79899 Other long term (current) drug therapy: Secondary | ICD-10-CM | POA: Diagnosis not present

## 2017-11-01 DIAGNOSIS — E119 Type 2 diabetes mellitus without complications: Secondary | ICD-10-CM | POA: Diagnosis not present

## 2017-11-01 DIAGNOSIS — Z794 Long term (current) use of insulin: Secondary | ICD-10-CM | POA: Diagnosis not present

## 2017-11-01 DIAGNOSIS — Y999 Unspecified external cause status: Secondary | ICD-10-CM | POA: Diagnosis not present

## 2017-11-01 DIAGNOSIS — Y92410 Unspecified street and highway as the place of occurrence of the external cause: Secondary | ICD-10-CM | POA: Diagnosis not present

## 2017-11-01 DIAGNOSIS — M7918 Myalgia, other site: Secondary | ICD-10-CM

## 2017-11-01 DIAGNOSIS — M25552 Pain in left hip: Secondary | ICD-10-CM | POA: Insufficient documentation

## 2017-11-01 DIAGNOSIS — I10 Essential (primary) hypertension: Secondary | ICD-10-CM | POA: Diagnosis not present

## 2017-11-01 LAB — POC URINE PREG, ED: Preg Test, Ur: NEGATIVE

## 2017-11-01 MED ORDER — IBUPROFEN 600 MG PO TABS: 600.0000 mg | ORAL_TABLET | Freq: Four times a day (QID) | ORAL | 0 refills | Status: DC | PRN

## 2017-11-01 MED ORDER — METHOCARBAMOL 750 MG PO TABS: 750.0000 mg | ORAL_TABLET | Freq: Four times a day (QID) | ORAL | 0 refills | Status: DC | PRN
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
## 2017-11-17 MED FILL — ATORVASTATIN CALCIUM 20 MG: 20 | 90 days supply | Qty: 90 | Fill #2

## 2017-11-17 MED FILL — AMLODIPINE BESYLATE 5 MG TA: 5 | 90 days supply | Qty: 90 | Fill #2

## 2017-11-17 MED FILL — metFORMIN HCL 1000 MG TABS: 1000 | 90 days supply | Qty: 180 | Fill #2

## 2018-01-07 MED FILL — HYDROCHLOROTHIAZIDE 25 MG T: 25 | 90 days supply | Qty: 45 | Fill #3

## 2018-01-07 MED FILL — LISINOPRIL 40 MG TABLET: 40 | 90 days supply | Qty: 90 | Fill #3

## 2018-02-24 MED FILL — AMLODIPINE BESYLATE 5 MG TA: 5 | 90 days supply | Qty: 90 | Fill #3

## 2018-02-24 MED FILL — metFORMIN HCL 1000 MG TABS: 1000 | 90 days supply | Qty: 180 | Fill #3

## 2018-02-24 MED FILL — ATORVASTATIN CALCIUM 20 MG: 20 | 90 days supply | Qty: 90 | Fill #3

## 2018-09-09 ENCOUNTER — Other Ambulatory Visit: Payer: Self-pay

## 2018-09-09 ENCOUNTER — Encounter: Payer: Self-pay | Admitting: Family Medicine

## 2018-09-09 ENCOUNTER — Ambulatory Visit (INDEPENDENT_AMBULATORY_CARE_PROVIDER_SITE_OTHER): Payer: No Typology Code available for payment source | Admitting: Family Medicine

## 2018-09-09 VITALS — BP 160/100 | HR 99 | Ht 68.0 in | Wt 322.2 lb

## 2018-09-09 DIAGNOSIS — J4520 Mild intermittent asthma, uncomplicated: Secondary | ICD-10-CM

## 2018-09-09 DIAGNOSIS — L299 Pruritus, unspecified: Secondary | ICD-10-CM

## 2018-09-09 DIAGNOSIS — G8929 Other chronic pain: Secondary | ICD-10-CM

## 2018-09-09 DIAGNOSIS — M545 Low back pain, unspecified: Secondary | ICD-10-CM

## 2018-09-09 DIAGNOSIS — I10 Essential (primary) hypertension: Secondary | ICD-10-CM | POA: Diagnosis not present

## 2018-09-09 DIAGNOSIS — E119 Type 2 diabetes mellitus without complications: Secondary | ICD-10-CM

## 2018-09-09 DIAGNOSIS — E782 Mixed hyperlipidemia: Secondary | ICD-10-CM | POA: Diagnosis not present

## 2018-09-09 DIAGNOSIS — Z794 Long term (current) use of insulin: Secondary | ICD-10-CM

## 2018-09-09 DIAGNOSIS — Z114 Encounter for screening for human immunodeficiency virus [HIV]: Secondary | ICD-10-CM

## 2018-09-09 MED ORDER — METHOCARBAMOL 750 MG PO TABS: 750.0000 mg | ORAL_TABLET | Freq: Four times a day (QID) | ORAL | 0 refills | Status: DC | PRN

## 2018-09-09 MED ORDER — METFORMIN HCL 1000 MG PO TABS: 1000.0000 mg | ORAL_TABLET | Freq: Two times a day (BID) | ORAL | 3 refills | Status: DC

## 2018-09-09 MED ORDER — CETIRIZINE HCL 10 MG PO TABS: 10.0000 mg | ORAL_TABLET | Freq: Every day | ORAL | 3 refills | Status: DC

## 2018-09-09 MED ORDER — INSULIN GLARGINE 100 UNIT/ML ~~LOC~~ SOLN: 42.0000 [IU] | Freq: Every day | SUBCUTANEOUS | 6 refills | Status: DC

## 2018-09-09 MED ORDER — ATORVASTATIN CALCIUM 20 MG PO TABS: 20.0000 mg | ORAL_TABLET | Freq: Every day | ORAL | 3 refills | Status: DC

## 2018-09-09 MED ORDER — HYDROCHLOROTHIAZIDE 25 MG PO TABS: 12.5000 mg | ORAL_TABLET | Freq: Every day | ORAL | 3 refills | Status: DC

## 2018-09-09 MED ORDER — LOSARTAN POTASSIUM 50 MG PO TABS: 50.0000 mg | ORAL_TABLET | Freq: Every day | ORAL | 0 refills | Status: DC

## 2018-09-09 MED ORDER — GLIPIZIDE ER 5 MG PO TB24: 5.0000 mg | ORAL_TABLET | Freq: Every day | ORAL | 3 refills | Status: DC

## 2018-09-09 MED ORDER — ALBUTEROL SULFATE HFA 108 (90 BASE) MCG/ACT IN AERS: 2.0000 | INHALATION_SPRAY | Freq: Four times a day (QID) | RESPIRATORY_TRACT | 2 refills | Status: DC | PRN

## 2018-09-09 MED ORDER — FLUOCINOLONE ACETONIDE SCALP 0.01 % EX OIL: 1.0000 | TOPICAL_OIL | Freq: Every day | CUTANEOUS | 2 refills | Status: DC

## 2018-09-09 MED ORDER — AMLODIPINE BESYLATE 5 MG PO TABS: 5.0000 mg | ORAL_TABLET | Freq: Every day | ORAL | 3 refills | Status: DC

## 2018-09-09 MED FILL — glipiZIDE XL 5 MG TB24: 5 | 90 days supply | Qty: 90 | Fill #0

## 2018-09-09 MED FILL — LANTUS 100 UNITS/ML VIAL: 100 | 24 days supply | Qty: 10 | Fill #0

## 2018-09-09 MED FILL — ATORVASTATIN 20 MG TABLET: 20 | 90 days supply | Qty: 90 | Fill #0

## 2018-09-09 MED FILL — ALBUTEROL SULFATE HFA 108 (: 108 (90 BAS | 25 days supply | Qty: 9 | Fill #0

## 2018-09-09 MED FILL — metFORMIN HCL 1000 MG TABS: 1000 | 90 days supply | Qty: 180 | Fill #0

## 2018-09-09 MED FILL — FLUOCINOLONE ACETONIDE SCAL: 0.01 | 30 days supply | Qty: 118 | Fill #0

## 2018-09-09 MED FILL — AMLODIPINE BESYLATE 5 MG TA: 5 | 90 days supply | Qty: 90 | Fill #0

## 2018-09-09 MED FILL — HYDROCHLOROTHIAZIDE 25 MG T: 25 | 90 days supply | Qty: 45 | Fill #0

## 2018-09-09 MED FILL — LOSARTAN POTASSIUM 50 MG TA: 50 | 30 days supply | Qty: 30 | Fill #0

## 2018-09-09 MED FILL — METHOCARBAMOL 750 MG TABS: 750 | 5 days supply | Qty: 20 | Fill #0

## 2018-09-09 NOTE — Patient Instructions (Addendum)
Thank you for coming in to see [place] today! Please see below to review our plan for today's visit:  1. Check out [facility] - Red booklet about meal planning and carb counting [email or web address]_Planning_[email or web address]_[email or web address] 2. Pick up ALL your meds from the pharmacy.  3. We will see you at 1:55pm on 09/20/2018 for pap smear  Please call the clinic at [phone] if your symptoms worsen or you have any concerns. It was our pleasure to serve you!    Dr. Milus Banister [facility]

## 2018-09-09 NOTE — Assessment & Plan Note (Signed)
-  Patient to look over [facility] booklet, limiting carbohydrate intake to 150 carbohydrates daily -Patient to restart medications -Follow-up 09/20/2018

## 2018-09-09 NOTE — Assessment & Plan Note (Signed)
Blood pressure 160/100 at today's visit -Patient to restart amlodipine, HCTZ, and losartan (instead of lisinopril)

## 2018-09-09 NOTE — Progress Notes (Signed)
   Subjective:    Patient ID: [name], female    DOB: 05/23/1980, 38 y.o.   MRN: [id number]   CC: Patient encounter, medication reconciliation, itchy scalp  HPI: T2DM: Patient with longstanding history of diabetes, last A1c 11%, she has not taken her medications (Lantus 42 units daily, metformin, glipizide) in over 1 month.  She denies polyuria, polydipsia, polyphagia, and neuropathies.  She would like to restart her diabetes medications.  HTN: Patient blood pressure today 160/100, patient has not been on her medications (hydrochlorothiazide, lisinopril, amlodipine) in over 1 month.  She denies headaches, blurry vision, chest pain, shortness of breath, and abdominal pain.  She would like to restart her hypertension medications.  Itchy Scalp: The patient reports having an itchy scalp to the point where she is scratching and causing her hair follicles to die.  She has not tried any new shampoos or products recently.  She denies itching anywhere else.  Smoking status reviewed: Non-smoker  Review of Systems see HPI   Objective:  BP (!) 160/100   Pulse 99   Ht 5\' 8"  (1.727 m)   Wt (!) 322 lb 4 oz (146.2 kg)   LMP 09/01/2018   SpO2 99%   BMI 49.00 kg/m  Vitals and nursing note reviewed  General: well nourished, in no acute distress, pleasant patient HEENT: Thinning hair, no concerning lesions or abrasions to scalp  Cardiac: RRR, clear S1 and S2, no murmurs appreciated Respiratory: CTA bilaterally, no increased work of breathing Extremities: no edema or cyanosis. Warm, well perfused. 2+ radial and PT pulses bilaterally Skin: warm and dry, no rashes noted Neuro: alert and oriented, no focal deficits   Assessment & Plan:   Diabetes mellitus type 2, insulin dependent ([place]) -Patient to look over [facility] booklet, limiting carbohydrate intake to 150 carbohydrates daily -Patient to restart medications -Follow-up 09/20/2018  Hypertension Blood pressure 160/100 at  today's visit -Patient to restart amlodipine, HCTZ, and losartan (instead of lisinopril)   Return in about 11 days (around 09/20/2018).   Dr. Milus Banister [facility], PGY-2

## 2018-09-20 ENCOUNTER — Ambulatory Visit (INDEPENDENT_AMBULATORY_CARE_PROVIDER_SITE_OTHER): Payer: No Typology Code available for payment source | Admitting: Family Medicine

## 2018-09-20 ENCOUNTER — Encounter: Payer: Self-pay | Admitting: Family Medicine

## 2018-09-20 ENCOUNTER — Other Ambulatory Visit (HOSPITAL_COMMUNITY)
Admission: RE | Admit: 2018-09-20 | Discharge: 2018-09-20 | Disposition: A | Discharge status: Home / Self Care | Payer: No Typology Code available for payment source | Source: Ambulatory Visit | Attending: Family Medicine | Admitting: Family Medicine

## 2018-09-20 ENCOUNTER — Other Ambulatory Visit: Payer: Self-pay | Admitting: Family Medicine

## 2018-09-20 ENCOUNTER — Other Ambulatory Visit: Payer: Self-pay

## 2018-09-20 VITALS — BP 130/78 | HR 102 | Ht 68.0 in | Wt 322.4 lb

## 2018-09-20 DIAGNOSIS — E119 Type 2 diabetes mellitus without complications: Secondary | ICD-10-CM

## 2018-09-20 DIAGNOSIS — Z0000 Encounter for general adult medical examination without abnormal findings: Secondary | ICD-10-CM | POA: Insufficient documentation

## 2018-09-20 DIAGNOSIS — Z23 Encounter for immunization: Secondary | ICD-10-CM | POA: Diagnosis not present

## 2018-09-20 DIAGNOSIS — Z794 Long term (current) use of insulin: Secondary | ICD-10-CM

## 2018-09-20 LAB — POCT GLYCOSYLATED HEMOGLOBIN (HGB A1C): HbA1c, POC (controlled diabetic range): 12.3 % — AB (ref 0.0–7.0)

## 2018-09-20 MED ORDER — INSULIN GLARGINE 100 UNIT/ML SOLOSTAR PEN: 42.0000 [IU] | PEN_INJECTOR | SUBCUTANEOUS | 11 refills | Status: DC

## 2018-09-20 MED ORDER — "NEEDLE (DISP) 30G X 1/2"" MISC": 1.0000 | Freq: Every day | 0 refills | Status: DC

## 2018-09-20 MED FILL — LANTUS SOLOSTAR 100 UNITS/M: 100 | 21 days supply | Qty: 9 | Fill #0

## 2018-09-20 MED FILL — UNIFINE PENTIPS 8MM 31G: 31G X 8 MM | 90 days supply | Qty: 100 | Fill #0

## 2018-09-20 NOTE — Patient Instructions (Addendum)
Thank you for coming in to see us today! Please see below to review our plan for today's visit:  1. Look into eye doctor and if visit counts as diabetes eye check. 2. Stop taking Glipizide! 3. We are drawing a bunch of labs - I will call you with results.  4. Know that Atorvastatin and Losartan are "no bueno" for pregnancy. Please take extra precautions to not get pregnant while on these meds.   Please call the clinic at [phone] if your symptoms worsen or you have any concerns. It was our pleasure to serve you!     Dr. Peggyann ShoalsHannah [facility]   Health Maintenance, Female Adopting a healthy lifestyle and getting preventive care are important in promoting health and wellness. Ask your health care provider about:  The right schedule for you to have regular tests and exams.  Things you can do on your own to prevent diseases and keep yourself healthy. What should I know about diet, weight, and exercise? Eat a healthy diet   Eat a diet that includes plenty of vegetables, fruits, low-fat dairy products, and lean protein.  Do not eat a lot of foods that are high in solid fats, added sugars, or sodium. Maintain a healthy weight Body mass index (BMI) is used to identify weight problems. It estimates body fat based on height and weight. Your health care provider can help determine your BMI and help you achieve or maintain a healthy weight. Get regular exercise Get regular exercise. This is one of the most important things you can do for your health. Most adults should:  Exercise for at least 150 minutes each week. The exercise should increase your heart rate and make you sweat (moderate-intensity exercise).  Do strengthening exercises at least twice a week. This is in addition to the moderate-intensity exercise.  Spend less time sitting. Even light physical activity can be beneficial. Watch cholesterol and blood lipids Have your blood tested for lipids and  cholesterol at 36 years of age, then have this test every 5 years. Have your cholesterol levels checked more often if:  Your lipid or cholesterol levels are high.  You are older than 36 years of age.  You are at high risk for heart disease. What should I know about cancer screening? Depending on your health history and family history, you may need to have cancer screening at various ages. This may include screening for:  Breast cancer.  Cervical cancer.  Colorectal cancer.  Skin cancer.  Lung cancer. What should I know about heart disease, diabetes, and high blood pressure? Blood pressure and heart disease  High blood pressure causes heart disease and increases the risk of stroke. This is more likely to develop in people who have high blood pressure readings, are of African descent, or are overweight.  Have your blood pressure checked: ? Every 3-5 years if you are 5618-36 years of age. ? Every year if you are 537 years old or older. Diabetes Have regular diabetes screenings. This checks your fasting blood sugar level. Have the screening done:  Once every three years after age 36 if you are at a normal weight and have a low risk for diabetes.  More often and at a younger age if you are overweight or have a high risk for diabetes. What should I know about preventing infection? Hepatitis B If you have a higher risk for hepatitis B, you should be screened for this virus. Talk with your health care provider to  find out if you are at risk for hepatitis B infection. Hepatitis C Testing is recommended for:  Everyone born from [birth year] through 1963.  Anyone with known risk factors for hepatitis C. Sexually transmitted infections (STIs)  Get screened for STIs, including gonorrhea and chlamydia, if: ? You are sexually active and are younger than 36 years of age. ? You are older than 36 years of age and your health care provider tells you that you are at risk for this type of  infection. ? Your sexual activity has changed since you were last screened, and you are at increased risk for chlamydia or gonorrhea. Ask your health care provider if you are at risk.  Ask your health care provider about whether you are at high risk for HIV. Your health care provider may recommend a prescription medicine to help prevent HIV infection. If you choose to take medicine to prevent HIV, you should first get tested for HIV. You should then be tested every 3 months for as long as you are taking the medicine. Pregnancy  If you are about to stop having your period (premenopausal) and you may become pregnant, seek counseling before you get pregnant.  Take 400 to 800 micrograms (mcg) of folic acid every day if you become pregnant.  Ask for birth control (contraception) if you want to prevent pregnancy. Osteoporosis and menopause Osteoporosis is a disease in which the bones lose minerals and strength with aging. This can result in bone fractures. If you are 36 years old or older, or if you are at risk for osteoporosis and fractures, ask your health care provider if you should:  Be screened for bone loss.  Take a calcium or vitamin D supplement to lower your risk of fractures.  Be given hormone replacement therapy (HRT) to treat symptoms of menopause. Follow these instructions at home: Lifestyle  Do not use any products that contain nicotine or tobacco, such as cigarettes, e-cigarettes, and chewing tobacco. If you need help quitting, ask your health care provider.  Do not use street drugs.  Do not share needles.  Ask your health care provider for help if you need support or information about quitting drugs. Alcohol use  Do not drink alcohol if: ? Your health care provider tells you not to drink. ? You are pregnant, may be pregnant, or are planning to become pregnant.  If you drink alcohol: ? Limit how much you use to 0-1 drink a day. ? Limit intake if you are  breastfeeding.  Be aware of how much alcohol is in your drink. In the [place] one drink equals one 12 oz bottle of beer (355 mL), one 5 oz glass of wine (148 mL), or one 1 oz glass of hard liquor (44 mL). General instructions  Schedule regular health, dental, and eye exams.  Stay current with your vaccines.  Tell your health care provider if: ? You often feel depressed. ? You have ever been abused or do not feel safe at home. Summary  Adopting a healthy lifestyle and getting preventive care are important in promoting health and wellness.  Follow your health care provider's instructions about healthy diet, exercising, and getting tested or screened for diseases.  Follow your health care provider's instructions on monitoring your cholesterol and blood pressure. This information is not intended to replace advice given to you by your health care provider. Make sure you discuss any questions you have with your health care provider. Document Released: 08/18/2010 Document Revised: 01/26/2018 Document Reviewed: 01/26/2018  Elsevier Patient Education  [employer].     Why follow it? Research shows. . Those who follow the Mediterranean diet have a reduced risk of heart disease  . The diet is associated with a reduced incidence of Parkinson's and Alzheimer's diseases . People following the diet may have longer life expectancies and lower rates of chronic diseases  . The Dietary Guidelines for Americans recommends the Mediterranean diet as an eating plan to promote health and prevent disease  What Is the Mediterranean Diet?  . Healthy eating plan based on typical foods and recipes of Mediterranean-style cooking . The diet is primarily a plant based diet; these foods should make up a majority of meals   Starches - Plant based foods should make up a majority of meals - They are an important sources of vitamins, minerals, energy, antioxidants, and fiber - Choose whole grains, foods high in  fiber and minimally processed items  - Typical grain sources include wheat, oats, barley, corn, brown rice, bulgar, farro, millet, polenta, couscous  - Various types of beans include chickpeas, lentils, fava beans, black beans, white beans   Fruits  Veggies - Large quantities of antioxidant rich fruits & veggies; 6 or more servings  - Vegetables can be eaten raw or lightly drizzled with oil and cooked  - Vegetables common to the traditional Mediterranean Diet include: artichokes, arugula, beets, broccoli, brussel sprouts, cabbage, carrots, celery, collard greens, cucumbers, eggplant, kale, leeks, lemons, lettuce, mushrooms, okra, onions, peas, peppers, potatoes, pumpkin, radishes, rutabaga, shallots, spinach, sweet potatoes, turnips, zucchini - Fruits common to the Mediterranean Diet include: apples, apricots, avocados, cherries, clementines, dates, figs, grapefruits, grapes, melons, nectarines, oranges, peaches, pears, pomegranates, strawberries, tangerines  Fats - Replace butter and margarine with healthy oils, such as olive oil, canola oil, and tahini  - Limit nuts to no more than a handful a day  - Nuts include walnuts, almonds, pecans, pistachios, pine nuts  - Limit or avoid candied, honey roasted or heavily salted nuts - Olives are central to the [employer] - can be eaten whole or used in a variety of dishes   Meats Protein - Limiting red meat: no more than a few times a month - When eating red meat: choose lean cuts and keep the portion to the size of deck of cards - Eggs: approx. 0 to 4 times a week  - Fish and lean poultry: at least 2 a week  - Healthy protein sources include, chicken, [place], lean beef, lamb - Increase intake of seafood such as tuna, salmon, trout, mackerel, shrimp, scallops - Avoid or limit high fat processed meats such as sausage and bacon  Dairy - Include moderate amounts of low fat dairy products  - Focus on healthy dairy such as fat free yogurt, skim milk,  low or reduced fat cheese - Limit dairy products higher in fat such as whole or 2% milk, cheese, ice cream  Alcohol - Moderate amounts of red wine is ok  - No more than 5 oz daily for women (all ages) and men older than age 40  - No more than 10 oz of wine daily for men younger than 20  Other - Limit sweets and other desserts  - Use herbs and spices instead of salt to flavor foods  - Herbs and spices common to the traditional Mediterranean Diet include: basil, bay leaves, chives, cloves, cumin, fennel, garlic, lavender, marjoram, mint, oregano, parsley, pepper, rosemary, sage, savory, sumac, tarragon, thyme   It's not  just a diet, it's a lifestyle:  . The Mediterranean diet includes lifestyle factors typical of those in the region  . Foods, drinks and meals are best eaten with others and savored . Daily physical activity is important for overall good health . This could be strenuous exercise like running and aerobics . This could also be more leisurely activities such as walking, housework, yard-work, or taking the stairs . Moderation is the key; a balanced and healthy diet accommodates most foods and drinks . Consider portion sizes and frequency of consumption of certain foods   Meal Ideas & Options:  . Breakfast:  o Whole wheat toast or whole wheat English muffins with peanut butter & hard boiled egg o Steel cut oats topped with apples & cinnamon and skim milk  o Fresh fruit: banana, strawberries, melon, berries, peaches  o Smoothies: strawberries, bananas, greek yogurt, peanut butter o Low fat greek yogurt with blueberries and granola  o Egg white omelet with spinach and mushrooms o Breakfast couscous: whole wheat couscous, apricots, skim milk, cranberries  . Sandwiches:  o Hummus and grilled vegetables (peppers, zucchini, squash) on whole wheat bread   o Grilled chicken on whole wheat pita with lettuce, tomatoes, cucumbers or tzatziki  o Tuna salad on whole wheat bread: tuna salad  made with greek yogurt, olives, red peppers, capers, green onions o Garlic rosemary lamb pita: lamb sauted with garlic, rosemary, salt & pepper; add lettuce, cucumber, greek yogurt to pita - flavor with lemon juice and black pepper  . Seafood:  o Mediterranean grilled salmon, seasoned with garlic, basil, parsley, lemon juice and black pepper o Shrimp, lemon, and spinach whole-grain pasta salad made with low fat greek yogurt  o Seared scallops with lemon orzo  o Seared tuna steaks seasoned salt, pepper, coriander topped with tomato mixture of olives, tomatoes, olive oil, minced garlic, parsley, green onions and cappers  . Meats:  o Herbed greek chicken salad with kalamata olives, cucumber, feta  o Red bell peppers stuffed with spinach, bulgur, lean ground beef (or lentils) & topped with feta   o Kebabs: skewers of chicken, tomatoes, onions, zucchini, squash  o [place] burgers: made with red onions, mint, dill, lemon juice, feta cheese topped with roasted red peppers . Vegetarian o Cucumber salad: cucumbers, artichoke hearts, celery, red onion, feta cheese, tossed in olive oil & lemon juice  o Hummus and whole grain pita points with a greek salad (lettuce, tomato, feta, olives, cucumbers, red onion) o Lentil soup with celery, carrots made with vegetable broth, garlic, salt and pepper  o Tabouli salad: parsley, bulgur, mint, scallions, cucumbers, tomato, radishes, lemon juice, olive oil, salt and pepper.

## 2018-09-20 NOTE — Progress Notes (Signed)
   Subjective:    Patient ID: [name], female    DOB: Oct 08, 1980, 38 y.o.   MRN: [id number]   CC: annual physical exam  HPI: Patient presenting for annual physical exam, Pap smear and lab work  Type 2 diabetes: Last A1c 11.7% over 1 year ago, does not have more recent BMP on file.  Patient is taking 42 units of Lantus daily, as well as metformin and glipizide. She denies any polydipsia, polyphagia, polyuria, or neuropathy. She also denies fevers, headaches, vision changes, chest pain, shortness of breath, abdominal pain, and myalgias.  Gyn Exam: patient reports no concerning discharge, odors, pelvic pain or pain with urination. Patient has one child, does not currently desire another child but is also not on birth control. Patient is taking atorvastatin and Cozaar which are contraindicated in pregnancy - patient was warned of this and patient vocalizes understanding of potential danger to fetus.  Smoking status reviewed: non smoker  Review of Systems: see HPI  Objective:  BP 130/78   Pulse (!) 102   Ht 5\' 8"  (1.727 m)   Wt (!) 322 lb 6 oz (146.2 kg)   LMP 08/20/2018   SpO2 98%   BMI 49.02 kg/m   General: well nourished, in no acute distress Neck: no LAD or thyromegaly  Cardiac: RRR, clear S1 and S2, no murmurs appreciated Respiratory: CTA bilaterally, no increased work of breathing Abdomen: soft, nontender to palpation,  GU exam: No abnormalities visualized, nabothian cyst appreciated on bimanual exam, no tenderness to palpation or movement of cervix, no unusual discharge or anatomical features appreciated, ovaries palpated with minimal tenderness elicited Extremities: no edema or cyanosis. Warm, well perfused. 2+ radial and PT pulses bilaterally Skin: warm and dry, no rashes noted   Assessment & Plan:   Encounter for annual physical examination excluding gynecological examination in a patient older than 17 years -Will f/u with patient for pap smear results -Will  order A1c, BMP, TSH, and Lipid panel  Diabetes mellitus type 2, insulin dependent (HCC) Patient taking 42 units of Lantus daily, as well as 1000 mg metformin and glipizide 5 mg daily -Discontinue glipizide -Ordering A1c and urine microalbumin/creatinine ratio -Patient overdue for pneumococcal vaccination  Return if symptoms worsen or fail to improve.   Dr. Milus Banister [facility], PGY-2

## 2018-09-21 DIAGNOSIS — Z0000 Encounter for general adult medical examination without abnormal findings: Secondary | ICD-10-CM | POA: Insufficient documentation

## 2018-09-21 LAB — LIPID PANEL
Chol/HDL Ratio: 2.8 ratio (ref 0.0–4.4)
Cholesterol, Total: 178 mg/dL (ref 100–199)
HDL: 64 mg/dL (ref >39)
LDL Calculated: 93 mg/dL (ref 0–99)
Triglycerides: 104 mg/dL (ref 0–149)
VLDL Cholesterol Cal: 21 mg/dL (ref 5–40)

## 2018-09-21 LAB — BASIC METABOLIC PANEL
BUN/Creatinine Ratio: 13 (ref 9–23)
BUN: 15 mg/dL (ref 6–20)
CO2: 21 mmol/L (ref 20–29)
Calcium: 9.9 mg/dL (ref 8.7–10.2)
Chloride: 97 mmol/L (ref 96–106)
Creatinine, Ser: 1.12 mg/dL — ABNORMAL HIGH (ref 0.57–1.00)
GFR calc Af Amer: 74 mL/min/{1.73_m2} (ref >59)
GFR calc non Af Amer: 64 mL/min/{1.73_m2} (ref >59)
Glucose: 258 mg/dL — ABNORMAL HIGH (ref 65–99)
Potassium: 4.2 mmol/L (ref 3.5–5.2)
Sodium: 135 mmol/L (ref 134–144)

## 2018-09-21 LAB — RPR: RPR Ser Ql: NONREACTIVE

## 2018-09-21 LAB — TSH: TSH: 2.66 u[IU]/mL (ref 0.450–4.500)

## 2018-09-21 LAB — HIV ANTIBODY (ROUTINE TESTING W REFLEX): HIV Screen 4th Generation wRfx: NONREACTIVE

## 2018-09-21 LAB — MICROALBUMIN / CREATININE URINE RATIO
Creatinine, Urine: 227.3 mg/dL
Microalb/Creat Ratio: 132 mg/g creat — ABNORMAL HIGH (ref 0–29)
Microalbumin, Urine: 299.1 ug/mL

## 2018-09-21 NOTE — Assessment & Plan Note (Signed)
Patient taking 42 units of Lantus daily, as well as 1000 mg metformin and glipizide 5 mg daily -Discontinue glipizide -Ordering A1c and urine microalbumin/creatinine ratio -Patient overdue for pneumococcal vaccination

## 2018-09-21 NOTE — Assessment & Plan Note (Signed)
-  Will f/u with patient for pap smear results -Will order A1c, BMP, TSH, and Lipid panel

## 2018-09-23 ENCOUNTER — Other Ambulatory Visit: Payer: Self-pay | Admitting: Family Medicine

## 2018-09-23 MED ORDER — EMPAGLIFLOZIN 10 MG PO TABS: 10.0000 mg | ORAL_TABLET | Freq: Every day | ORAL | 2 refills | Status: DC

## 2018-09-23 MED ORDER — CANAGLIFLOZIN 100 MG PO TABS: 100.0000 mg | ORAL_TABLET | Freq: Every day | ORAL | 2 refills | Status: DC

## 2018-09-24 LAB — CYTOLOGY - PAP
Adequacy: ABSENT
Chlamydia: NEGATIVE
Diagnosis: NEGATIVE
HPV: NOT DETECTED
Neisseria Gonorrhea: NEGATIVE
Trichomonas: NEGATIVE

## 2018-09-29 ENCOUNTER — Encounter: Payer: Self-pay | Admitting: Family Medicine

## 2018-10-04 MED FILL — LANTUS SOLOSTAR 100 UNITS/M: 100 | 21 days supply | Qty: 9 | Fill #0

## 2018-10-04 MED FILL — UNIFINE PENTIPS 8MM 31G: 31G X 8 MM | 90 days supply | Qty: 100 | Fill #0

## 2018-10-07 ENCOUNTER — Other Ambulatory Visit: Payer: Self-pay | Admitting: Family Medicine

## 2018-10-07 DIAGNOSIS — B9689 Other specified bacterial agents as the cause of diseases classified elsewhere: Secondary | ICD-10-CM

## 2018-10-07 DIAGNOSIS — N760 Acute vaginitis: Secondary | ICD-10-CM

## 2018-10-07 DIAGNOSIS — Z794 Long term (current) use of insulin: Secondary | ICD-10-CM

## 2018-10-07 DIAGNOSIS — E119 Type 2 diabetes mellitus without complications: Secondary | ICD-10-CM

## 2018-10-07 DIAGNOSIS — I10 Essential (primary) hypertension: Secondary | ICD-10-CM

## 2018-10-07 MED ORDER — METRONIDAZOLE 500 MG PO TABS: 500.0000 mg | ORAL_TABLET | Freq: Two times a day (BID) | ORAL | 0 refills | Status: DC

## 2018-10-07 MED ORDER — LOSARTAN POTASSIUM 50 MG PO TABS: 50.0000 mg | ORAL_TABLET | Freq: Every day | ORAL | 3 refills | Status: DC

## 2018-10-07 MED ORDER — EMPAGLIFLOZIN 10 MG PO TABS: 10.0000 mg | ORAL_TABLET | Freq: Every day | ORAL | 3 refills | Status: DC

## 2018-10-07 MED FILL — LOSARTAN POTASSIUM 50 MG TA: 50 | 90 days supply | Qty: 90 | Fill #0

## 2018-10-07 MED FILL — metroNIDAZOLE 500 MG TABS: 500 | 7 days supply | Qty: 14 | Fill #0

## 2018-10-07 MED FILL — JARDIANCE 10 MG TABLET: 10 | 90 days supply | Qty: 90 | Fill #0

## 2018-10-13 MED FILL — JARDIANCE 10 MG TABLET: 10 | 90 days supply | Qty: 90 | Fill #0

## 2018-10-13 MED FILL — UNIFINE PENTIPS 8MM 31G: 31G X 8 MM | 90 days supply | Qty: 100 | Fill #0

## 2018-10-13 MED FILL — LANTUS SOLOSTAR 100 UNITS/M: 100 | 28 days supply | Qty: 12 | Fill #0

## 2018-11-07 ENCOUNTER — Encounter: Payer: Self-pay | Admitting: Family Medicine

## 2018-11-14 ENCOUNTER — Ambulatory Visit (INDEPENDENT_AMBULATORY_CARE_PROVIDER_SITE_OTHER): Payer: No Typology Code available for payment source | Admitting: Family Medicine

## 2018-11-14 ENCOUNTER — Other Ambulatory Visit: Payer: Self-pay

## 2018-11-14 ENCOUNTER — Encounter: Payer: No Typology Code available for payment source | Admitting: Family Medicine

## 2018-11-14 ENCOUNTER — Encounter: Payer: Self-pay | Admitting: Family Medicine

## 2018-11-14 VITALS — BP 122/86 | HR 95 | Wt 319.8 lb

## 2018-11-14 DIAGNOSIS — Z111 Encounter for screening for respiratory tuberculosis: Secondary | ICD-10-CM | POA: Diagnosis not present

## 2018-11-14 DIAGNOSIS — Z23 Encounter for immunization: Secondary | ICD-10-CM | POA: Diagnosis not present

## 2018-11-14 NOTE — Patient Instructions (Signed)
It was a pleasure to see you today! Thank you for choosing [facility] for your primary care. [name] was seen for documentation paperwork for work. Come back to the clinic if there is anything else I can do for you.  Discontinue today, congratulations on your nursing school.  Today we gave you a copy of your medical records from your last visit after he signed a release for that.  Since he said that you failed your hep B titer we are starting hep B series.  You get your first shot today, your next shot in 1 to 2 months and then 1 in 4 to 6 months  We also started your TB test today, you will need to come back in 48 to 72 hours to get that checked.   Please bring all your medications to every doctors visit   Sign up for My Chart to have easy access to your labs results, and communication with your Primary care physician.     Please check-out at the front desk before leaving the clinic.     Best,  Dr. Sherene Sires FAMILY MEDICINE RESIDENT - PGY3 11/14/2018 4:46 PM

## 2018-11-14 NOTE — Progress Notes (Signed)
    Subjective:  [name] is a 38 y.o. female who presents to the [facility] today with a chief complaint of PPD testing and vaccination for work.   HPI: No complaints, just needs vaccination and PPD for work.  PPD screening test Patient needs PPD for work, has not been vaccinated in the past and therefore does not require QuantiFERON gold.  Does not have any symptoms or suspicion of actually having transmission.  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  Need for vaccination against hepatitis B virus Immunization records to show 3 complete childhood immunizations, however patient failed her titers were drawn for her last work.  Will require new series.    Objective:  Physical Exam: BP 122/86   Pulse 95   Wt (!) 319 lb 12.8 oz (145.1 kg)   LMP 10/26/2018 (Approximate)   SpO2 99%   BMI 48.63 kg/m   Gen: NAD, conversing comfortably CV: RRR with no murmurs appreciated Pulm: NWOB, CTAB with no crackles, wheezes, or rhonchi MSK: no edema, cyanosis, or clubbing noted Skin: warm, dry Neuro: grossly normal, moves all extremities Psych: Normal affect and thought content  No results found for this or any previous visit (from the past 72 hour(s)).   Assessment/Plan:  PPD screening test Patient needs PPD for work, has not been vaccinated in the past and therefore does not require QuantiFERON gold.  Does not have any symptoms or suspicion of actually having transmission.  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  Need for vaccination against hepatitis B virus Immunization records to show 3 complete childhood immunizations, however patient failed her titers were drawn for her last work.  Will require new series.     Sherene Sires, DO FAMILY MEDICINE RESIDENT - PGY3 11/17/2018 8:03 AM

## 2018-11-14 NOTE — Progress Notes (Signed)
PDD test given 11/14/2019 @ 4:58 pm  in rt forearm. Pt informed to come back in 2 days to have the test read. Pt understood and will come in on Thurs am as she will be working the night before.  Ottis Stain, CMA

## 2018-11-17 DIAGNOSIS — Z111 Encounter for screening for respiratory tuberculosis: Secondary | ICD-10-CM | POA: Insufficient documentation

## 2018-11-17 DIAGNOSIS — Z23 Encounter for immunization: Secondary | ICD-10-CM | POA: Insufficient documentation

## 2018-11-17 NOTE — Assessment & Plan Note (Signed)
Patient needs PPD for work, has not been vaccinated in the past and therefore does not require QuantiFERON gold.  Does not have any symptoms or suspicion of actually having transmission.  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

## 2018-11-17 NOTE — Assessment & Plan Note (Signed)
Immunization records to show 3 complete childhood immunizations, however patient failed her titers were drawn for her last work.  Will require new series.

## 2018-12-12 MED FILL — AMLODIPINE BESYLATE 5 MG TA: 5 | 90 days supply | Qty: 90 | Fill #1

## 2018-12-12 MED FILL — HYDROCHLOROTHIAZIDE 25 MG T: 25 | 90 days supply | Qty: 45 | Fill #1

## 2018-12-12 MED FILL — metFORMIN HCL 1000 MG TABS: 1000 | 90 days supply | Qty: 180 | Fill #1

## 2018-12-23 MED FILL — ATORVASTATIN 20 MG TABLET: 20 | 90 days supply | Qty: 90 | Fill #1

## 2019-01-02 MED FILL — UNIFINE PENTIPS 8MM 31G: 31G X 8 MM | 90 days supply | Qty: 100 | Fill #0

## 2019-01-02 MED FILL — ATORVASTATIN 20 MG TABLET: 20 | 90 days supply | Qty: 90 | Fill #1

## 2019-01-02 MED FILL — LANTUS SOLOSTAR 100 UNITS/M: 100 | 28 days supply | Qty: 12 | Fill #0

## 2019-01-02 MED FILL — JARDIANCE 10 MG TABLET: 10 | 90 days supply | Qty: 90 | Fill #1

## 2019-01-16 MED FILL — LOSARTAN POTASSIUM 50 MG TA: 50 | 90 days supply | Qty: 90 | Fill #1

## 2019-02-20 ENCOUNTER — Encounter: Payer: Self-pay | Admitting: Family Medicine

## 2019-02-23 ENCOUNTER — Other Ambulatory Visit: Payer: Self-pay | Admitting: Family Medicine

## 2019-02-23 DIAGNOSIS — B379 Candidiasis, unspecified: Secondary | ICD-10-CM

## 2019-02-23 MED ORDER — FLUCONAZOLE 150 MG PO TABS: 150.0000 mg | ORAL_TABLET | Freq: Once | ORAL | 0 refills | Status: AC

## 2019-02-23 MED FILL — FLUCONAZOLE 150 MG TABLET: 150 | 1 days supply | Qty: 1 | Fill #0

## 2019-03-13 MED FILL — HYDROCHLOROTHIAZIDE 25 MG T: 25 | 90 days supply | Qty: 45 | Fill #2

## 2019-03-13 MED FILL — AMLODIPINE BESYLATE 5 MG TA: 5 | 90 days supply | Qty: 90 | Fill #2

## 2019-03-13 MED FILL — metFORMIN HCL 1000 MG TABS: 1000 | 90 days supply | Qty: 180 | Fill #2

## 2019-03-13 MED FILL — FLUOCINOLONE ACETONIDE SCAL: 0.01 | 30 days supply | Qty: 118 | Fill #1

## 2019-03-20 ENCOUNTER — Encounter: Payer: Self-pay | Admitting: Family Medicine

## 2019-03-23 ENCOUNTER — Telehealth: Payer: Self-pay

## 2019-03-23 NOTE — Telephone Encounter (Signed)
Called pt to ask COVID screening questions for tomorrow's appt. No answer, mailbox is full. If pt calls, please ask screening questions. Sunday Spillers, CMA

## 2019-03-24 ENCOUNTER — Ambulatory Visit: Payer: No Typology Code available for payment source

## 2019-03-24 ENCOUNTER — Other Ambulatory Visit: Payer: Self-pay

## 2019-03-24 DIAGNOSIS — Z111 Encounter for screening for respiratory tuberculosis: Secondary | ICD-10-CM

## 2019-03-24 NOTE — Progress Notes (Signed)
Patient presents in clinic for screening for TB. This is a requirement for attending clinicals. Quantiferon gold test ordered and to be completed by lab.   Veronda Prude, RN

## 2019-03-24 NOTE — Addendum Note (Signed)
Addended by: Jennette Bill on: 03/24/2019 02:17 PM   Modules accepted: Orders

## 2019-03-26 LAB — QUANTIFERON-TB GOLD PLUS
QuantiFERON Mitogen Value: >10 IU/mL
QuantiFERON Nil Value: 0.04 IU/mL
QuantiFERON TB1 Ag Value: 0.04 IU/mL
QuantiFERON TB2 Ag Value: 0.03 IU/mL
QuantiFERON-TB Gold Plus: NEGATIVE

## 2019-03-30 ENCOUNTER — Encounter: Payer: Self-pay | Admitting: Family Medicine

## 2019-04-14 MED FILL — ATORVASTATIN 20 MG TABLET: 20 | 90 days supply | Qty: 90 | Fill #2

## 2019-04-14 MED FILL — LOSARTAN POTASSIUM 50 MG TA: 50 | 90 days supply | Qty: 90 | Fill #2

## 2019-04-14 MED FILL — JARDIANCE 10 MG TABLET: 10 | 90 days supply | Qty: 90 | Fill #2

## 2019-04-14 MED FILL — LANTUS SOLOSTAR 100 UNITS/M: 100 | 28 days supply | Qty: 12 | Fill #1

## 2019-05-11 ENCOUNTER — Other Ambulatory Visit: Payer: Self-pay | Admitting: Family Medicine

## 2019-05-11 DIAGNOSIS — E119 Type 2 diabetes mellitus without complications: Secondary | ICD-10-CM

## 2019-05-11 DIAGNOSIS — Z794 Long term (current) use of insulin: Secondary | ICD-10-CM

## 2019-05-11 MED ORDER — LEVEMIR FLEXTOUCH 100 UNIT/ML ~~LOC~~ SOPN: 42.0000 [IU] | PEN_INJECTOR | Freq: Every day | SUBCUTANEOUS | 3 refills | Status: DC

## 2019-05-12 ENCOUNTER — Telehealth: Payer: Self-pay

## 2019-05-12 DIAGNOSIS — E119 Type 2 diabetes mellitus without complications: Secondary | ICD-10-CM

## 2019-05-12 MED FILL — LANTUS SOLOSTAR 100 UNITS/M: 100 | 28 days supply | Qty: 12 | Fill #2

## 2019-05-12 NOTE — Telephone Encounter (Signed)
Pharmacy calls nurse line stating next month the patients insurance will change, therefore Lantus will no longer be preferred. Levemir is covered, however has a copay of $80. Per pharmacist, going forward [name] is going to be the cheapest option for patient, as the pharmacy can provide a coupon.

## 2019-05-17 ENCOUNTER — Other Ambulatory Visit: Payer: Self-pay | Admitting: Family Medicine

## 2019-05-17 DIAGNOSIS — G8929 Other chronic pain: Secondary | ICD-10-CM

## 2019-05-31 MED ORDER — BASAGLAR KWIKPEN 100 UNIT/ML ~~LOC~~ SOPN: 42.0000 [IU] | PEN_INJECTOR | SUBCUTANEOUS | 11 refills | Status: DC

## 2019-05-31 NOTE — Telephone Encounter (Signed)
LM for patient ok per dpr that medication has been called into the pharmacy.  Kadir Azucena,CMA

## 2019-05-31 NOTE — Telephone Encounter (Signed)
Basaglar sent to pharmacy. Please call patient and let her know. Let us know if she has questions.   Terisa Starr, MD  Family Medicine Teaching Service

## 2019-05-31 NOTE — Addendum Note (Signed)
Addended by: Manson Passey, Jusiah Aguayo on: 05/31/2019 10:59 AM   Modules accepted: Orders

## 2019-06-05 MED FILL — BASAGLAR 100 UNIT/ML KWIKPE: 100 | 28 days supply | Qty: 12 | Fill #0

## 2019-06-07 ENCOUNTER — Encounter: Payer: Self-pay | Admitting: Family Medicine

## 2019-06-07 MED FILL — HYDROCHLOROTHIAZIDE 25 MG T: 25 | 90 days supply | Qty: 45 | Fill #3

## 2019-06-07 MED FILL — metFORMIN HCL 1000 MG TABS: 1000 | 90 days supply | Qty: 180 | Fill #3

## 2019-06-07 MED FILL — AMLODIPINE BESYLATE 5 MG TA: 5 | 90 days supply | Qty: 90 | Fill #3

## 2019-06-08 ENCOUNTER — Ambulatory Visit (INDEPENDENT_AMBULATORY_CARE_PROVIDER_SITE_OTHER): Payer: No Typology Code available for payment source | Admitting: Pharmacist

## 2019-06-08 ENCOUNTER — Other Ambulatory Visit: Payer: Self-pay

## 2019-06-08 DIAGNOSIS — E119 Type 2 diabetes mellitus without complications: Secondary | ICD-10-CM | POA: Diagnosis not present

## 2019-06-08 DIAGNOSIS — Z794 Long term (current) use of insulin: Secondary | ICD-10-CM | POA: Diagnosis not present

## 2019-06-08 MED ORDER — OZEMPIC (0.25 OR 0.5 MG/DOSE) 2 MG/1.5ML ~~LOC~~ SOPN: 0.2500 mg | PEN_INJECTOR | SUBCUTANEOUS | 0 refills | Status: DC

## 2019-06-08 MED ORDER — FREESTYLE LIBRE 2 SENSOR MISC: 1.0000 | 11 refills | Status: DC

## 2019-06-08 MED ORDER — OZEMPIC (0.25 OR 0.5 MG/DOSE) 2 MG/1.5ML ~~LOC~~ SOPN: 0.2500 mg | PEN_INJECTOR | SUBCUTANEOUS | 3 refills | Status: DC

## 2019-06-08 MED ORDER — EMPAGLIFLOZIN 25 MG PO TABS: 25.0000 mg | ORAL_TABLET | Freq: Every day | ORAL | 3 refills | Status: DC

## 2019-06-08 MED FILL — OZEMPIC 0.25 OR 0.5 MG/DOSE: 2 | 56 days supply | Qty: 2 | Fill #0

## 2019-06-08 MED FILL — JARDIANCE 25 MG TABLET: 25 | 90 days supply | Qty: 90 | Fill #0

## 2019-06-08 NOTE — Patient Instructions (Addendum)
It was a pleasure seeing you in clinic today!!  Today the plan is.. 1. Continue metformin 1000 mg twice daily, Jardiance 10 mg take 2 tablets daily until gone, then take 25 mg daily, Basaglar 42 units daily 2. START using Ozempic 0.25 mg subQ once weekly. We will work with insurance about prior authorization. 3. START Freestyle Libre 2.0. We will work with insurance about coverage   Call our office if you notice blood sugar < 80.  Please remember with Freestyle Libre 2.0 the following information: 1. Sensor will last 14 days. Make sure to scan sensor at least every 8 hours 2. Sensor should be applied to area away from waistband, scarring, tattoos, irritation, and bones. 3. Transmitter must be within 20 feet of receiver/cell phone. 4. Do a fingerstick blood glucose test if the sensor readings do not match how    you feel 5. Remove sensor prior to magnetic resonance imaging (MRI), computed tomography (CT) scan, or high-frequency electrical heat (diathermy) treatment. 6. Freestyle Libre 2.0 may be worn through a Industrial/product designer. It may not be exposed to an advanced Imaging Technology (AIT) body scanner (also called a millimeter wave scanner) or the baggage x-ray machine. Instead, ask for hand-wanding or full-body pat-down and visual inspection.  7. Doses of Vitamin C > 500 mg and dehydration may cause false high readings.  [employer] Information 1. Customer Sales Support  Phone number: [phone] Everyday  8 AM - 8 PM   Please call the PharmD clinic at [phone] if you have any questions that you would like to speak with a pharmacist about (Dr. Paulino Rily or Dr. Zachery Conch).

## 2019-06-08 NOTE — Assessment & Plan Note (Signed)
Diabetes longstanding since ~2014 currently uncontrolled. Patient is able to verbalize appropriate hypoglycemia management plan. Patient is adherent with medication. 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 Freestyle Libre CGM likely more affordable than Dexcom. Thoroughly educated patient on [employer] and [employer] 2.0. Patient would prefer FreeStyle Libre 2.0 considering it has alarms. Freestyle Libre 2.0 CGM was placed successfully on back of patient's left arm.  -Continued basal insulin Basaglar (insulin glargine) at 42 units once daily. Provided patient with copay card so Basaglar $5 for 30 day supply.  -Started GLP-1 Ozempic (generic name semaglutide) 0.25 mg subQ once weekly. Will initiate PA process. Depending on copay, may send information regarding $25 copay card if necessary.  -Increased dose of SGLT2-I Jardiance (generic name empagliflozin) from 10 mg daily to 25 mg daily.  - Started Freestyle Libre 2.0 CGM. Will initiate PA process to determine if insurance coverage is possible; if not cheaper than $75 for 30 day supply will instruct patient to set up $75 offer via [employer] for 30 day supply. -Extensively discussed pathophysiology of diabetes, recommended lifestyle interventions, dietary effects on blood sugar control -Counseled on s/sx of and management of hypoglycemia -Next A1C anticipated follow up appt.

## 2019-06-08 NOTE — Progress Notes (Signed)
S:     Chief Complaint  Patient presents with  . Medication Management    DM    Patient arrives in good spirits ambulating without assistance.  Presents for diabetes evaluation, education, and management. Patient was referred and last seen by Primary Care Provider, Dr. Ouida Sills, on 09/2018. 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 Patient has a 55 year old son. Son is learning at home virtually (8th grade). She sleeps 7-8 hours. Best time to contact patient is ~8:30 AM. Patient is interested in CGM considering busy lifestyle. She confirms she is comfortable paying $75 if necessary for CGM.    Patient reports Diabetes was diagnosed ~2014.   Family/Social History: mother (DM); maternal grandmother (DM); aunts/uncles (DM)  Insurance coverage/medication affordability: [place] [employer] Focus Plan  Patient reports adherence with medications.  Current diabetes medications include: metformin 1000 mg twice daily, Jardiance 10 mg daily, Basaglar 42 units daily Current hypertension medications include: amlodipine 5 mg daily, losartan 50 mg daily Current hyperlipidemia medications include: atorvastatin 20 mg daily   Patient reports hypoglycemic events "every couple months" Hypoglycemic s/sx: nausea, sweaty Hypoglycemic management: juice  Patient reported dietary habits: Eats 2 meals/day Breakfast (8 AM): biscuit Dinner (1-2AM): salads (gets tired of them though), cup of noodles, cafeteria food (sandwich, tray) Snacks: nabs, fruit (apple, orange) Drinks: water, sprite zero, sweet tea (sometimes regular, sometimes artificial sweetener)  Patient-reported exercise habits: denies   Patient reports ~1x nocturia (nighttime urination).  Patient denies neuropathy (nerve pain). Patient denies visual changes. She follows with ophthalmology - will be seeing provider in the next upcoming months. Patient reports  self foot exams.     O:  Physical Exam Constitutional:      Appearance: Normal appearance. She is obese.  Neurological:     Mental Status: She is alert.  Psychiatric:        Mood and Affect: Mood normal.        Behavior: Behavior normal.        Thought Content: Thought content normal.        Judgment: Judgment normal.      Review of Systems  Genitourinary:       Polyuria  All other systems reviewed and are negative.    Lab Results  Component Value Date   HGBA1C 12.3 (A) 09/20/2018   There were no vitals filed for this visit.  Lipid Panel     Component Value Date/Time   CHOL 178 09/20/2018 1553   TRIG 104 09/20/2018 1553   HDL 64 09/20/2018 1553   CHOLHDL 2.8 09/20/2018 1553   CHOLHDL 3.6 03/16/2017 1056   LDLCALC 93 09/20/2018 1553   LDLCALC 155 (H) 03/16/2017 1056    Checks BG 1x daily Home fasting blood sugars: 200 - 300 mg/dL (more commonly 250-300 mg/dL)     Clinical Atherosclerotic Cardiovascular Disease (ASCVD): No  The ASCVD Risk score Mikey Bussing DC Jr., et al., 2013) failed to calculate for the following reasons:   The 2013 ASCVD risk score is only valid for ages 2 to 34    Freestyle Libre 2.0 patient education  Person(s)instructed: Patient  Instruction: CGM overview and set-up 1. Button, touch screen, and icons 2. Power supply and recharging 3. Home screen 4. Date and time 5. Set BG target range: 80-250 mg/dL 6. Set alarm/alert tone  7. Interstitial vs. capillary blood glucose readings  8.  When to verify sensor reading with fingerstick blood glucose  Sensor application -- sensor placed on back of left arm 1. Site selection and site prep with alcohol pad/Skin Tac 2. Sensor prep-sensor pack and sensor applicator 3. Starting the sensor: 1 hour warm up before BG readings available    Will ask for fingersticks the first 12 hours   4. Sensor change every 14 days and rotate site 5. Call [employer] customer service if sensor comes off before 14 days   Safety and Troubleshooting 1. Scan the sensor at least every 8 hours 2. When the "test BG" symbol appears, test fingerstick blood glucose prior to    making treatment decisions 3. Do a fingerstick blood glucose test if the sensor readings do not match how    you feel 4. Remove sensor prior for MRI or CT. Sensor may be damaged by exposure to    airport x-ray screening 5. Vitamin C may cause false high readings and aspirin may cause false low     readings 6. Store sensor kit between 39 and 77 degrees. Can be refrigerated at this temp.  Contact information provided for [employer] customer service and/or trainer.   A/P: Diabetes longstanding since ~2014 currently uncontrolled. Patient is able to verbalize appropriate hypoglycemia management plan. Patient is adherent with medication. 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 Freestyle Libre CGM likely more affordable than Dexcom. Thoroughly educated patient on [employer] and [employer] 2.0. Patient would prefer FreeStyle Libre 2.0 considering it has alarms. Freestyle Libre 2.0 CGM was placed successfully on back of patient's left arm.  -Continued basal insulin Basaglar (insulin glargine) at 42 units once daily. Provided patient with copay card so Basaglar $5 for 30 day supply.  -Started GLP-1 Ozempic (generic name semaglutide) 0.25 mg subQ once weekly. Will initiate PA process. Depending on copay, may send information regarding $25 copay card if necessary.  -Increased dose of SGLT2-I Jardiance (generic name empagliflozin) from 10 mg daily to 25 mg daily.  - Started Freestyle Libre 2.0 CGM. Will initiate PA process to determine if insurance coverage is possible; if not cheaper than $75 for 30 day supply will instruct patient to set up $75 offer via [employer]  for 30 day supply. -Extensively discussed pathophysiology of diabetes, recommended lifestyle interventions, dietary effects on blood sugar control -Counseled on s/sx of and management of hypoglycemia -Next A1C anticipated follow up appt.   ASCVD risk - primary prevention in patient with diabetes. Last LDL is not controlled, however, is from 2019. Unable to calculate ASCVD score considering patient is younger than 37 years old. Discussed risk vs benefit of statin use considering she is of child bearing potential. Patient confirmed she is not interested in becoming pregnant again. She will notify provider if she changes her mind. Aspirin is not indicated.  -Will update lipid panel at follow up appt and consider HLD medication intensification at that time considering multiple issues were addressed at this appt related to DM control (medication adjustments (Jardiance, Ozempic); initiating CGM) -Continued atorvastatin 20 mg (will consider increasing dose and/or stacking Zetia 10 mg daily at follow up appt).   Hypertension currently controlled.  Blood pressure goal = <130/80 mmHg. Patient medication adherence optimal. -Continue HTN medication regimen (losartan 50 mg daily, amlodipine 5 mg daily)   Written patient instructions provided.  Total time in face to face counseling 60 minutes.   Follow up Pharmacist via telephone in 1 week to provide status update about prior authorizations (Ozempic, Freestyle Libre 2.0) and re-assess DM management.   Patient seen with Maryan Rued, PharmD PGY-1 Pharmacy Resident and Drexel Iha, PharmD, PGY2 Pharmacy Resident.

## 2019-06-09 ENCOUNTER — Telehealth: Payer: Self-pay

## 2019-06-09 NOTE — Progress Notes (Signed)
Reviewed: I agree with Dr. Koval's documentation and management. 

## 2019-06-12 NOTE — Telephone Encounter (Signed)
Just waiting for updates from  [facility].  Aquilla Solian, CMA

## 2019-06-13 NOTE — Progress Notes (Signed)
4/26 Phone call from [facility] outpatient pharmacy [place] Coverage - non-preferred status.   Discussed the following with Pharmacist - Duard Larsen costs are: $62 per month for #3 sensors $30/ month - $90 payment per sensor (3 month supply).  Cost of Dexcom is higher than $75 per month with Libre.  However, if patient is part of active health DM management - all testing supplies are reimbursed quarterly - bringing costs to $0.   Alternatively, she could also submit her DM supplies through her medical benefit and they would be 100% covered.  This would be a durable medical type (non-[facility] pharmacy managed) benefit.    Called patient and left message with her to return call so that we could discuss options.

## 2019-06-21 MED FILL — JARDIANCE 25 MG TABLET: 25 | 90 days supply | Qty: 90 | Fill #0

## 2019-06-21 MED FILL — OZEMPIC 0.25 OR 0.5 MG/DOSE: 2 | 56 days supply | Qty: 2 | Fill #0

## 2019-06-21 MED FILL — BASAGLAR 100 UNIT/ML KWIKPE: 100 | 28 days supply | Qty: 12 | Fill #0

## 2019-06-24 ENCOUNTER — Encounter: Payer: Self-pay | Admitting: Family Medicine

## 2019-06-26 NOTE — Telephone Encounter (Signed)
Fax received from pharmacy again inquiring about patient's device.  Will send to pcp and pharmacy.  Carlyle Mcelrath,CMA

## 2019-06-29 NOTE — Telephone Encounter (Signed)
Jasmin from [facility] calls nurse line regarding freestyle libre 2 sensor. This is not covered by insurance, dexcom is the preferred sensor and will also require PA. Please place new order for the dexcom meter or advise if patient is a reasonable candidate for this device.   To PCP and Dr. Weston Settle, RN

## 2019-07-04 ENCOUNTER — Other Ambulatory Visit: Payer: Self-pay | Admitting: Family Medicine

## 2019-07-06 NOTE — Telephone Encounter (Addendum)
Contacted [facility] to discuss scenario.   Dexcom is preferred for [facility] Employee Insurance [facility] Josephine Igo is not covered).  However, patient needs 4 x daily testing of CBGs to qualify.  Which she is NOT due to only taking 1 shot per day of Basaglar (insulin glargine) and 1 shot per week of Ozempic.  She is not taking bolus insulin.   I left message for patient to contact [phone] (FreeStyle $75 max contact line) and attempt to obtain approval (with a co-pay or prescription care possibly) at this $75 per month amount.   I will plan to contact patient in 1 week to assess progress on this step.

## 2019-07-07 MED FILL — metFORMIN HCL 1000 MG TABS: 1000 | 90 days supply | Qty: 180 | Fill #3

## 2019-07-07 MED FILL — HYDROCHLOROTHIAZIDE 25 MG T: 25 | 90 days supply | Qty: 45 | Fill #3

## 2019-07-07 MED FILL — AMLODIPINE BESYLATE 5 MG TA: 5 | 90 days supply | Qty: 90 | Fill #3

## 2019-07-13 ENCOUNTER — Telehealth: Payer: Self-pay | Admitting: Pharmacist

## 2019-07-13 DIAGNOSIS — E119 Type 2 diabetes mellitus without complications: Secondary | ICD-10-CM

## 2019-07-13 DIAGNOSIS — Z794 Long term (current) use of insulin: Secondary | ICD-10-CM

## 2019-07-13 MED ORDER — FREESTYLE LIBRE 2 SENSOR MISC: 1.0000 | 11 refills | Status: DC

## 2019-07-13 NOTE — Telephone Encounter (Signed)
Called patient on 07/13/2019 at 8:15 AM   Patient would prefer to be on Freestyle Libre 2.0 rather than Dexcom (prior authorization requires 3 insulin injections - patient only on 1 insulin injection currently). Patient has been provided [employer] 2.0 reader previously and was trained on [employer] 2.0 CGM use by pharmacy team at prior pharmacy appt on 06/08/2019. Patient states she would prefer that I send the Freestyle Libre 2.0 sensors prescription to [facility] [place].   Thank you for involving pharmacy to assist in providing this patient's care.   Zachery Conch, PharmD PGY2 Ambulatory Care Pharmacy Resident

## 2019-07-21 NOTE — Telephone Encounter (Signed)
Attempted to contact RE Freestyle Libre  Left message to return call.  Data sharing connection set-up is goal of next contact if patient has functional CGM.

## 2019-07-27 ENCOUNTER — Telehealth: Payer: Self-pay | Admitting: Pharmacist

## 2019-07-27 NOTE — Telephone Encounter (Signed)
Called patient to follow-up on Libre continuous glucose monitor. Patient does not have the sensor yet but plans to go to the pharmacy with max $75 card tomorrow. Will follow-up via phone call in one week. Patient prefers to be called at 3:30-4:00pm.

## 2019-08-18 MED FILL — BASAGLAR 100 UNIT/ML KWIKPE: 100 | 28 days supply | Qty: 12 | Fill #1

## 2019-08-18 MED FILL — ATORVASTATIN 20 MG TABLET: 20 | 90 days supply | Qty: 90 | Fill #3

## 2019-08-18 MED FILL — LOSARTAN POTASSIUM 50 MG TA: 50 | 90 days supply | Qty: 90 | Fill #3

## 2019-10-03 MED FILL — OZEMPIC 0.25 OR 0.5 MG/DOSE: 2 | 56 days supply | Qty: 2 | Fill #1

## 2019-10-07 ENCOUNTER — Encounter: Payer: Self-pay | Admitting: Family Medicine

## 2019-10-09 ENCOUNTER — Other Ambulatory Visit: Payer: Self-pay | Admitting: Family Medicine

## 2019-10-09 DIAGNOSIS — Z794 Long term (current) use of insulin: Secondary | ICD-10-CM

## 2019-10-09 DIAGNOSIS — L299 Pruritus, unspecified: Secondary | ICD-10-CM

## 2019-10-09 DIAGNOSIS — I10 Essential (primary) hypertension: Secondary | ICD-10-CM

## 2019-10-09 DIAGNOSIS — E119 Type 2 diabetes mellitus without complications: Secondary | ICD-10-CM

## 2019-10-09 DIAGNOSIS — J4520 Mild intermittent asthma, uncomplicated: Secondary | ICD-10-CM

## 2019-10-09 DIAGNOSIS — E782 Mixed hyperlipidemia: Secondary | ICD-10-CM

## 2019-10-09 MED ORDER — FLUOCINOLONE ACETONIDE SCALP 0.01 % EX OIL: 1.0000 | TOPICAL_OIL | Freq: Every day | CUTANEOUS | 2 refills | Status: DC

## 2019-10-09 MED ORDER — BASAGLAR KWIKPEN 100 UNIT/ML ~~LOC~~ SOPN: 42.0000 [IU] | PEN_INJECTOR | SUBCUTANEOUS | 11 refills | Status: DC

## 2019-10-09 MED ORDER — VITAMIN D (CHOLECALCIFEROL) 50 MCG (2000 UT) PO CAPS: 2000.0000 [IU] | ORAL_CAPSULE | Freq: Every day | ORAL | 3 refills | Status: AC

## 2019-10-09 MED ORDER — VITAMIN C 100 MG PO TABS
100.0000 mg | ORAL_TABLET | Freq: Every day | ORAL | 3 refills | Status: DC
Start: 2019-10-09 — End: 2022-04-17

## 2019-10-09 MED ORDER — ATORVASTATIN CALCIUM 20 MG PO TABS: 20.0000 mg | ORAL_TABLET | Freq: Every day | ORAL | 3 refills | Status: DC

## 2019-10-09 MED ORDER — EMPAGLIFLOZIN 25 MG PO TABS: 25.0000 mg | ORAL_TABLET | Freq: Every day | ORAL | 3 refills | Status: DC

## 2019-10-09 MED ORDER — UNIFINE PENTIPS 31G X 8 MM MISC
3 refills | Status: DC
Start: 2019-10-09 — End: 2023-04-23

## 2019-10-09 MED ORDER — CETIRIZINE HCL 10 MG PO TABS: 10.0000 mg | ORAL_TABLET | Freq: Every day | ORAL | 3 refills | Status: AC

## 2019-10-09 MED ORDER — LOSARTAN POTASSIUM 50 MG PO TABS: 50.0000 mg | ORAL_TABLET | Freq: Every day | ORAL | 3 refills | Status: DC

## 2019-10-09 MED ORDER — AMLODIPINE BESYLATE 5 MG PO TABS: 5.0000 mg | ORAL_TABLET | Freq: Every day | ORAL | 3 refills | Status: DC

## 2019-10-09 MED ORDER — METFORMIN HCL 1000 MG PO TABS: 1000.0000 mg | ORAL_TABLET | Freq: Two times a day (BID) | ORAL | 3 refills | Status: DC

## 2019-10-09 MED ORDER — OZEMPIC (0.25 OR 0.5 MG/DOSE) 2 MG/1.5ML ~~LOC~~ SOPN: 0.2500 mg | PEN_INJECTOR | SUBCUTANEOUS | 3 refills | Status: DC

## 2019-10-09 MED ORDER — HYDROCHLOROTHIAZIDE 25 MG PO TABS: 12.5000 mg | ORAL_TABLET | Freq: Every day | ORAL | 3 refills | Status: DC

## 2019-10-09 MED FILL — HYDROCHLOROTHIAZIDE 25 MG T: 25 | 90 days supply | Qty: 45 | Fill #0

## 2019-10-09 MED FILL — metFORMIN HCL 1000 MG TABS: 1000 | 90 days supply | Qty: 180 | Fill #0

## 2019-10-09 MED FILL — JARDIANCE 25 MG TABLET: 25 | 90 days supply | Qty: 90 | Fill #0

## 2019-10-09 MED FILL — FLUOCINOLONE ACETONIDE SCAL: 0.01 | 30 days supply | Qty: 118 | Fill #0

## 2019-10-09 MED FILL — AMLODIPINE BESYLATE 5 MG TA: 5 | 90 days supply | Qty: 90 | Fill #0

## 2019-10-12 MED FILL — BASAGLAR 100 UNIT/ML KWIKPE: 100 | 28 days supply | Qty: 12 | Fill #0

## 2019-11-29 MED FILL — LOSARTAN POTASSIUM 50 MG TA: 50 | 90 days supply | Qty: 90 | Fill #0

## 2019-12-21 ENCOUNTER — Encounter: Payer: Self-pay | Admitting: Family Medicine

## 2020-01-10 ENCOUNTER — Ambulatory Visit (INDEPENDENT_AMBULATORY_CARE_PROVIDER_SITE_OTHER): Payer: No Typology Code available for payment source | Admitting: Family Medicine

## 2020-01-10 ENCOUNTER — Other Ambulatory Visit: Payer: Self-pay

## 2020-01-10 ENCOUNTER — Encounter: Payer: Self-pay | Admitting: Family Medicine

## 2020-01-10 VITALS — BP 140/80 | HR 105 | Wt 309.0 lb

## 2020-01-10 DIAGNOSIS — I10 Essential (primary) hypertension: Secondary | ICD-10-CM | POA: Diagnosis not present

## 2020-01-10 DIAGNOSIS — Z634 Disappearance and death of family member: Secondary | ICD-10-CM | POA: Diagnosis not present

## 2020-01-10 DIAGNOSIS — Z794 Long term (current) use of insulin: Secondary | ICD-10-CM

## 2020-01-10 DIAGNOSIS — Z566 Other physical and mental strain related to work: Secondary | ICD-10-CM

## 2020-01-10 DIAGNOSIS — E119 Type 2 diabetes mellitus without complications: Secondary | ICD-10-CM

## 2020-01-10 LAB — POCT GLYCOSYLATED HEMOGLOBIN (HGB A1C): Hemoglobin A1C: 10.7 % — AB (ref 4.0–5.6)

## 2020-01-10 MED ORDER — OZEMPIC (0.25 OR 0.5 MG/DOSE) 2 MG/1.5ML ~~LOC~~ SOPN: 0.5000 mg | PEN_INJECTOR | SUBCUTANEOUS | 6 refills | Status: DC

## 2020-01-10 MED FILL — LOSARTAN POTASSIUM 50 MG TA: 50 | 90 days supply | Qty: 90 | Fill #0

## 2020-01-10 MED FILL — FLUOCINOLONE ACETONIDE SCAL: 0.01 | 30 days supply | Qty: 118 | Fill #1

## 2020-01-10 MED FILL — METFORMIN HCL 1000 MG TABS: 1000 | 90 days supply | Qty: 180 | Fill #1

## 2020-01-10 MED FILL — HYDROCHLOROTHIAZIDE 25 MG T: 25 | 90 days supply | Qty: 45 | Fill #1

## 2020-01-10 MED FILL — JARDIANCE 25 MG TABLET: 25 | 90 days supply | Qty: 90 | Fill #1

## 2020-01-10 MED FILL — BASAGLAR 100 UNIT/ML KWIKPE: 100 | 28 days supply | Qty: 12 | Fill #1

## 2020-01-10 MED FILL — AMLODIPINE BESYLATE 5 MG TA: 5 | 90 days supply | Qty: 90 | Fill #1

## 2020-01-10 MED FILL — ATORVASTATIN CALCIUM 20 MG: 20 | 90 days supply | Qty: 90 | Fill #0

## 2020-01-10 NOTE — Progress Notes (Signed)
SUBJECTIVE:   CHIEF COMPLAINT / HPI:   Work-related stress: Patient presents to the clinic today regarding work-related stress.  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 Works in [employer], works 3 night shifts in a row 7p-7a weekly. Is also in school as a full time student, going from RN to BSN.  Patient feels that some time away from work would be beneficial to her mental health.  I support the patient's request for time away from work to keep her healthy and to help prevent burnout.  Of note her PHQ-9 score today is 21, answer to #9 is 0.  Patient reports being safe to herself and others.  Type 2 diabetes: Patient has a history of type 2 diabetes, her last HbA1c in August 2020 was elevated at 12.3%.  She is currently taking Jardiance, semaglutide, Metformin 1000 mg twice daily, and Lantus 42 units every morning. Today is 10.7%.   Hypertension: Patient has a history of high blood pressure for which she is prescribed amlodipine 5 mg, losartan 50 mg, and HCTZ 25 mg.  Today her blood pressure is 140/80.  No reports of headaches, chest pain, shortness of breath.  PERTINENT  PMH / PSH:  Patient Active Problem List   Diagnosis Date Noted  . Work-related stress 01/10/2020  . Need for vaccination against hepatitis B virus 11/17/2018  . PPD screening test 11/17/2018  . Encounter for annual physical examination excluding gynecological examination in a patient older than 17 years 09/21/2018  . Diabetes mellitus type 2, insulin dependent (HCC) 12/15/2012  . Asthma 12/15/2012  . Hypertension 12/15/2012     OBJECTIVE:   BP 140/80   Pulse (!) 105   Wt (!) 309 lb (140.2 kg)   SpO2 99%   BMI 46.98 kg/m    Physical exam: General: Pleasant patient, occasionally tearful on exam Respiratory: CTA bilaterally, comfortable work of breathing Cardio: RRR, S1-S2 present, no murmur  PHQ-9 score: 21, answer to question #9  0   ASSESSMENT/PLAN:   Hypertension BP today elevated at 140/80.  Patient currently taking amlodipine 5 mg, HCTZ 25, losartan 50.  Patient also acutely suffering from work-related stress.  She reports better blood pressure readings at home -Encourage patient to continue to monitor. -Can increase amlodipine up to 10 mg daily if BP remains elevated -Asked patient to follow-up in 2-4 weeks for repeat BP and stress check  Diabetes mellitus type 2, insulin dependent (HCC) HbA1c slightly improved today at 10.7%, down from previous 12.3%.  Patient given the following instructions: -Take Lantus 10 units in the morning when you get home from work and Lantus 40 units at night before work. Increase morning Lantus by 5 units until sugars are more stable (100-200s). -Remember dietary changes - 1/2 plate vegetables, 1/4 protein, 1/4 carb/fat mix (think Guac with chips) for every meal Continue injectable semaglutide, increase to 1 mg/week x 4 weeks, then 1.7 mg/week x4 weeks, then 2.4 mg/week -Continue to encourage weight loss  Work-related stress Patient with PHQ-9 score elevated at 21.  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 -Coping strategies were discussed -We will complete forms for FMLA -Patient can also visit behavioral health center on [address] and use [facility] as needed for stress -Patient to follow-up in 2-4 weeks regarding work-related stress and overall mood   Dollene Cleveland, DO  [place]

## 2020-01-10 NOTE — Patient Instructions (Addendum)
Thank you for coming in to see [place] today! Please see below to review our plan for today's visit:  1. Take Lantus 10 units in the morning when you get home from work and Lantus 40 units at night before work. Increase morning Lantus by 5 units until sugars are more stable (100-200s). 2. Remember dietary changes - 1/2 plate vegetables, 1/4 protein, 1/4 carb/fat mix (think Guac with chips). 3. Keep an eye on your BP - we can increase Amlodipine if it stays elevated.   Please call the clinic at [phone] if your symptoms worsen or you have any concerns. It was our pleasure to serve you!   Dr. Peggyann Sho[facility]

## 2020-01-15 ENCOUNTER — Encounter: Payer: Self-pay | Admitting: Family Medicine

## 2020-01-19 ENCOUNTER — Encounter: Payer: Self-pay | Admitting: Family Medicine

## 2020-01-19 ENCOUNTER — Telehealth: Payer: Self-pay | Admitting: Family Medicine

## 2020-01-19 NOTE — Telephone Encounter (Signed)
Clinical info completed on FMLA form.  Place form in Dr. Ewell Poe box for completion.  Ellise Kovack, CMA

## 2020-01-19 NOTE — Telephone Encounter (Signed)
Patient is dropping off FMLA paperwork to be filled out by Dr. Dareen Piano. LDOS was 01-10-20.   Patient would like for someone to call her and let her know when the form has been completed. She would like to have the form faxed to the number on the bottom of the form.   I have placed the form in the blue team folder.

## 2020-01-22 NOTE — Assessment & Plan Note (Signed)
HbA1c slightly improved today at 10.7%, down from previous 12.3%.  Patient given the following instructions: -Take Lantus 10 units in the morning when you get home from work and Lantus 40 units at night before work. Increase morning Lantus by 5 units until sugars are more stable (100-200s). -Remember dietary changes - 1/2 plate vegetables, 1/4 protein, 1/4 carb/fat mix (think Guac with chips) for every meal Continue injectable semaglutide, increase to 1 mg/week x 4 weeks, then 1.7 mg/week x4 weeks, then 2.4 mg/week -Continue to encourage weight loss

## 2020-01-22 NOTE — Assessment & Plan Note (Signed)
Patient with PHQ-9 score elevated at 21.  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 -Coping strategies were discussed -We will complete forms for FMLA -Patient can also visit behavioral health center on [address] and use [place] EACP as needed for stress

## 2020-01-22 NOTE — Assessment & Plan Note (Signed)
BP today elevated at 140/80.  Patient currently taking amlodipine 5 mg, HCTZ 25, losartan 50.  Patient also acutely suffering from work-related stress.  She reports better blood pressure readings at home -Encourage patient to continue to monitor. -Can increase amlodipine up to 10 mg daily if BP remains elevated -Asked patient to follow-up in 2-4 weeks for repeat BP and stress check

## 2020-01-23 DIAGNOSIS — Z634 Disappearance and death of family member: Secondary | ICD-10-CM | POA: Insufficient documentation

## 2020-01-25 ENCOUNTER — Ambulatory Visit: Payer: No Typology Code available for payment source | Admitting: Family Medicine

## 2020-01-26 NOTE — Telephone Encounter (Signed)
Copy made for batch scanning.  

## 2020-01-30 ENCOUNTER — Ambulatory Visit (INDEPENDENT_AMBULATORY_CARE_PROVIDER_SITE_OTHER): Payer: No Typology Code available for payment source | Admitting: Family Medicine

## 2020-01-30 ENCOUNTER — Other Ambulatory Visit: Payer: Self-pay

## 2020-01-30 VITALS — BP 120/78 | Ht 68.5 in | Wt 317.8 lb

## 2020-01-30 DIAGNOSIS — Z566 Other physical and mental strain related to work: Secondary | ICD-10-CM | POA: Diagnosis not present

## 2020-01-30 DIAGNOSIS — Z634 Disappearance and death of family member: Secondary | ICD-10-CM | POA: Diagnosis not present

## 2020-01-30 NOTE — Assessment & Plan Note (Signed)
PHQ-9 score today improved from 21 down to 9.  Patient over all is doing better, however would still benefit from talk therapy. -Patient agreeable to counseling -No medications needed for now -Patient intends to follow-up as needed -We will refill out FMLA paperwork

## 2020-01-30 NOTE — Patient Instructions (Addendum)
Thank you for coming in to see [place] today! Please see below to review our plan for today's visit:  1. Reach out to the EACP. 2. Follow up 2 months or so for diabetes check in.  3. Follow up sooner as needed for physical.   Please call the clinic at [phone] if your symptoms worsen or you have any concerns. It was our pleasure to serve you!   Dr. Peggyann Shoals [facility]

## 2020-01-30 NOTE — Assessment & Plan Note (Signed)
Overall patient is doing better. -Amendable to talk therapy -Hold medications for now

## 2020-01-30 NOTE — Progress Notes (Signed)
    SUBJECTIVE:   CHIEF COMPLAINT / HPI:   Follow-up work-related stress: This is a pleasant 39 year old female with history of type 2 diabetes presenting to clinic today for follow-up of her recent appointment for work-related stress and acute grief reaction.  At the time of her appointment PHQ-9 score was elevated at 21.  Coping strategies with grief were discussed, forms for FMLA for patient to have time away from work were filled out, and patient was instructed to visit the behavioral health center on [address] or use [place] EACP as needed for stress and acute grief.    Today she reports feeling much better.  She reports feeling less exhausted at work, also reports feeling supported at her work.  She has not yet reached out to the EACP but intends on doing so.  She would prefer to avoid medications and instead do talk therapy.  She reports that unfortunately the FMLA paperwork was not filled out completely/correctly and needs to be redone.  PERTINENT  PMH / PSH:  Diabetes, hypertension  OBJECTIVE:   BP 120/78   Ht 5' 8.5" (1.74 m)   Wt (!) 317 lb 12.8 oz (144.2 kg)   BMI 47.62 kg/m    Physical exam: General: Well-appearing, no apparent distress Respiratory: Comfortable work of breathing, speaking complete sentences Psych: Alert and oriented, making sound judgment, interactive   ASSESSMENT/PLAN:   Work-related stress PHQ-9 score today improved from 21 down to 9.  Patient over all is doing better, however would still benefit from talk therapy. -Patient agreeable to counseling -No medications needed for now -Patient intends to follow-up as needed -We will refill out FMLA paperwork  Bereavement Overall patient is doing better. -Amendable to talk therapy -Hold medications for now     Dollene Cleveland, DO [facility]

## 2020-02-02 ENCOUNTER — Other Ambulatory Visit: Payer: Self-pay

## 2020-02-02 DIAGNOSIS — Z794 Long term (current) use of insulin: Secondary | ICD-10-CM

## 2020-02-02 DIAGNOSIS — E119 Type 2 diabetes mellitus without complications: Secondary | ICD-10-CM

## 2020-02-17 ENCOUNTER — Encounter: Payer: Self-pay | Admitting: Family Medicine

## 2020-02-20 ENCOUNTER — Other Ambulatory Visit (HOSPITAL_COMMUNITY): Payer: Self-pay | Admitting: Family Medicine

## 2020-02-20 MED FILL — OZEMPIC (1 MG/DOSE) 4 MG/3M: 4 | 84 days supply | Qty: 9 | Fill #0

## 2020-04-15 MED FILL — LOSARTAN POTASSIUM 50 MG TA: 50 | 30 days supply | Qty: 30 | Fill #1

## 2020-04-15 MED FILL — HYDROCHLOROTHIAZIDE 25 MG T: 25 | 90 days supply | Qty: 45 | Fill #2

## 2020-04-15 MED FILL — FLUOCINOLONE ACETONIDE SCAL: 0.01 | 30 days supply | Qty: 118 | Fill #2

## 2020-04-15 MED FILL — AMLODIPINE BESYLATE 5 MG TA: 5 | 90 days supply | Qty: 90 | Fill #2

## 2020-04-15 MED FILL — METFORMIN HCL 1000 MG TABS: 1000 | 90 days supply | Qty: 180 | Fill #2

## 2020-04-15 MED FILL — JARDIANCE 25 MG TABLET: 25 | 90 days supply | Qty: 90 | Fill #2

## 2020-04-15 MED FILL — BASAGLAR 100 UNIT/ML KWIKPE: 100 | 28 days supply | Qty: 12 | Fill #2

## 2020-04-15 MED FILL — ATORVASTATIN CALCIUM 20 MG: 20 | 90 days supply | Qty: 90 | Fill #1

## 2020-05-29 ENCOUNTER — Other Ambulatory Visit (HOSPITAL_COMMUNITY): Payer: Self-pay

## 2020-05-29 MED FILL — Losartan Potassium Tab 50 MG: ORAL | 90 days supply | Qty: 90 | Fill #0 | Status: AC

## 2020-05-29 MED FILL — Semaglutide Soln Pen-inj 1 MG/DOSE (4 MG/3ML): SUBCUTANEOUS | 84 days supply | Qty: 9 | Fill #0 | Status: AC

## 2020-08-20 ENCOUNTER — Other Ambulatory Visit: Payer: Self-pay | Admitting: Family Medicine

## 2020-08-20 ENCOUNTER — Other Ambulatory Visit (HOSPITAL_COMMUNITY): Payer: Self-pay

## 2020-08-20 DIAGNOSIS — Z794 Long term (current) use of insulin: Secondary | ICD-10-CM

## 2020-08-20 DIAGNOSIS — E119 Type 2 diabetes mellitus without complications: Secondary | ICD-10-CM

## 2020-08-20 MED ORDER — INSULIN GLARGINE-YFGN 100 UNIT/ML ~~LOC~~ SOPN
42.0000 [IU] | PEN_INJECTOR | Freq: Every day | SUBCUTANEOUS | 11 refills | Status: DC
  Filled 2020-08-20: qty 3, 7d supply, fill #0
  Filled 2020-08-20 – 2020-12-04 (×2): qty 12, 28d supply, fill #0
  Filled 2021-03-10 – 2021-03-18 (×3): qty 12, 28d supply, fill #1
  Filled 2021-06-17: qty 12, 28d supply, fill #2

## 2020-08-20 MED FILL — Empagliflozin Tab 25 MG: ORAL | 90 days supply | Qty: 90 | Fill #0 | Status: AC

## 2020-08-20 MED FILL — Atorvastatin Calcium Tab 20 MG (Base Equivalent): ORAL | 90 days supply | Qty: 90 | Fill #0 | Status: AC

## 2020-08-20 MED FILL — Losartan Potassium Tab 50 MG: ORAL | 90 days supply | Qty: 90 | Fill #1 | Status: AC

## 2020-08-20 MED FILL — Metformin HCl Tab 1000 MG: ORAL | 90 days supply | Qty: 180 | Fill #0 | Status: AC

## 2020-08-20 MED FILL — Semaglutide Soln Pen-inj 1 MG/DOSE (4 MG/3ML): SUBCUTANEOUS | 84 days supply | Qty: 9 | Fill #1 | Status: AC

## 2020-08-20 MED FILL — Amlodipine Besylate Tab 5 MG (Base Equivalent): ORAL | 90 days supply | Qty: 90 | Fill #0 | Status: AC

## 2020-08-20 MED FILL — Hydrochlorothiazide Tab 25 MG: ORAL | 90 days supply | Qty: 45 | Fill #0 | Status: AC

## 2020-08-28 ENCOUNTER — Other Ambulatory Visit (HOSPITAL_COMMUNITY): Payer: Self-pay

## 2020-11-03 ENCOUNTER — Other Ambulatory Visit: Payer: Self-pay

## 2020-11-11 ENCOUNTER — Other Ambulatory Visit (HOSPITAL_COMMUNITY)
Admission: RE | Admit: 2020-11-11 | Discharge: 2020-11-11 | Disposition: A | Discharge status: Home / Self Care | Payer: No Typology Code available for payment source | Source: Ambulatory Visit | Attending: Obstetrics & Gynecology | Admitting: Obstetrics & Gynecology

## 2020-11-11 ENCOUNTER — Other Ambulatory Visit (HOSPITAL_COMMUNITY): Payer: Self-pay

## 2020-11-11 ENCOUNTER — Ambulatory Visit (INDEPENDENT_AMBULATORY_CARE_PROVIDER_SITE_OTHER): Payer: No Typology Code available for payment source | Admitting: Obstetrics & Gynecology

## 2020-11-11 ENCOUNTER — Other Ambulatory Visit: Payer: Self-pay

## 2020-11-11 ENCOUNTER — Encounter: Payer: Self-pay | Admitting: Obstetrics & Gynecology

## 2020-11-11 VITALS — BP 144/93 | HR 101 | Ht 68.0 in | Wt 310.0 lb

## 2020-11-11 DIAGNOSIS — B373 Candidiasis of vulva and vagina: Secondary | ICD-10-CM | POA: Diagnosis not present

## 2020-11-11 DIAGNOSIS — Z01419 Encounter for gynecological examination (general) (routine) without abnormal findings: Secondary | ICD-10-CM | POA: Insufficient documentation

## 2020-11-11 DIAGNOSIS — Z124 Encounter for screening for malignant neoplasm of cervix: Secondary | ICD-10-CM | POA: Insufficient documentation

## 2020-11-11 DIAGNOSIS — B3731 Acute candidiasis of vulva and vagina: Secondary | ICD-10-CM

## 2020-11-11 MED ORDER — FLUCONAZOLE 100 MG PO TABS
100.0000 mg | ORAL_TABLET | ORAL | 0 refills | Status: DC
  Filled 2020-11-11: qty 14, 28d supply, fill #0

## 2020-11-11 NOTE — Progress Notes (Signed)
Subjective:     [name] is a 40 y.o. female here for a routine exam.  Patient's last menstrual period was 10/19/2020. G1P1 Birth Control Method:  abstinence Menstrual Calendar(currently): regular  Current complaints: vulvovaginal itching.   Current acute medical issues:  DM with elevated A1C   Recent Gynecologic History Patient's last menstrual period was 10/19/2020. Last Pap: 2020  normal Last mammogram: n/a,    Past Medical History:  Diagnosis Date   Asthma    Diabetes mellitus    Hypertension     Past Surgical History:  Procedure Laterality Date   CESAREAN SECTION  2007   d/t gestational HTN   WISDOM TOOTH EXTRACTION      OB History     Gravida  1   Para  1   Term      Preterm      AB      Living  1      SAB      IAB      Ectopic      Multiple      Live Births  1           Social History   Socioeconomic History   Marital status: Single    Spouse name: Not on file   Number of children: Not on file   Years of education: Not on file   Highest education level: Not on file  Occupational History   Not on file  Tobacco Use   Smoking status: Never   Smokeless tobacco: Never  Vaping Use   Vaping Use: Never used  Substance and Sexual Activity   Alcohol use: Yes    Comment: every now and then   Drug use: No   Sexual activity: Not Currently    Partners: Male    Birth control/protection: None, Abstinence  Other Topics Concern   Not on file  Social History Narrative   Lives in [place], [place].   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    Exercises.   Single. Has one son, 70 year old.   Eats all food groups.   Wears seat belt.   Enjoys reading.   Attends church.   Lives with mom.    Has siblings, older brother.    Social Determinants of Health   Financial Resource Strain: Low Risk    Difficulty of Paying Living Expenses: Not hard at all  Food Insecurity: No Food Insecurity   Worried About Programme researcher, broadcasting/film/video in the Last  Year: Never true   Ran Out of Food in the Last Year: Never true  Transportation Needs: No Transportation Needs   Lack of Transportation (Medical): No   Lack of Transportation (Non-Medical): No  Physical Activity: Insufficiently Active   Days of Exercise per Week: 1 day   Minutes of Exercise per Session: 30 min  Stress: No Stress Concern Present   Feeling of Stress : Only a little  Social Connections: Socially Isolated   Frequency of Communication with Friends and Family: Once a week   Frequency of Social Gatherings with Friends and Family: Once a week   Attends Religious Services: 1 to 4 times per year   Active Member of [employer] or Organizations: No   Attends Banker Meetings: Never   Marital Status: Never married    Family History  Problem Relation Age of Onset   Diabetes Father    Cancer Father    Diabetes Mother    Asthma Mother  High Cholesterol Mother    Diabetes Brother      Current Outpatient Medications:    amLODipine (NORVASC) 5 MG tablet, TAKE 1 TABLET (5 MG TOTAL) BY MOUTH DAILY., Disp: 90 tablet, Rfl: 3   Ascorbic Acid (VITAMIN C) 100 MG tablet, Take 1 tablet (100 mg total) by mouth daily., Disp: 90 tablet, Rfl: 3   atorvastatin (LIPITOR) 20 MG tablet, TAKE 1 TABLET (20 MG TOTAL) BY MOUTH DAILY., Disp: 90 tablet, Rfl: 3   cetirizine (ZYRTEC) 10 MG tablet, Take 1 tablet (10 mg total) by mouth daily., Disp: 90 tablet, Rfl: 3   Continuous Blood Gluc Sensor (FREESTYLE LIBRE 2 SENSOR) MISC, Inject 1 Device into the skin every 14 (fourteen) days., Disp: 1 each, Rfl: 11   empagliflozin (JARDIANCE) 25 MG TABS tablet, TAKE 1 TABLET (25 MG TOTAL) BY MOUTH DAILY., Disp: 90 tablet, Rfl: 3   fluconazole (DIFLUCAN) 100 MG tablet, 1 tablet every other day til all taken, Disp: 14 tablet, Rfl: 0   hydrochlorothiazide (HYDRODIURIL) 25 MG tablet, TAKE 1/2 TABLET (12.5 MG TOTAL) BY MOUTH DAILY., Disp: 45 tablet, Rfl: 3   Insulin Glargine-yfgn (SEMGLEE, YFGN,) 100 UNIT/ML  SOPN, Inject 42 Units into the skin daily., Disp: 3 mL, Rfl: 11   losartan (COZAAR) 50 MG tablet, TAKE 1 TABLET (50 MG TOTAL) BY MOUTH DAILY., Disp: 90 tablet, Rfl: 3   metFORMIN (GLUCOPHAGE) 1000 MG tablet, TAKE 1 TABLET (1,000 MG TOTAL) BY MOUTH 2 (TWO) TIMES DAILY WITH A MEAL., Disp: 180 tablet, Rfl: 3   Semaglutide, 1 MG/DOSE, 4 MG/3ML SOPN, INJECT 1 MG INTO THE SKIN ONCE A WEEK., Disp: 9 mL, Rfl: 3   Semaglutide,0.25 or 0.5MG /DOS, (OZEMPIC, 0.25 OR 0.5 MG/DOSE,) 2 MG/1.5ML SOPN, Inject 0.5 mg into the skin once a week. 0.5mg  once weekly x4 weeks; 1.0mg  once weekly x4 weeks; 1.7mg  once weekly x4 weeks; 2.4mg  once weekly continued, Disp: 3 mL, Rfl: 6   UNIFINE PENTIPS 31G X 8 MM MISC, Use to inject insulin into skin daily., Disp: 90 each, Rfl: 3   Vitamin D, Cholecalciferol, 50 MCG (2000 UT) CAPS, Take 2,000 Units by mouth daily., Disp: 90 capsule, Rfl: 3  Review of Systems  Review of Systems  Constitutional: Negative for fever, chills, weight loss, malaise/fatigue and diaphoresis.  HENT: Negative for hearing loss, ear pain, nosebleeds, congestion, sore throat, neck pain, tinnitus and ear discharge.   Eyes: Negative for blurred vision, double vision, photophobia, pain, discharge and redness.  Respiratory: Negative for cough, hemoptysis, sputum production, shortness of breath, wheezing and stridor.   Cardiovascular: Negative for chest pain, palpitations, orthopnea, claudication, leg swelling and PND.  Gastrointestinal: negative for abdominal pain. Negative for heartburn, nausea, vomiting, diarrhea, constipation, blood in stool and melena.  Genitourinary: Negative for dysuria, urgency, frequency, hematuria and flank pain.  Musculoskeletal: Negative for myalgias, back pain, joint pain and falls.  Skin: Negative for itching and rash.  Neurological: Negative for dizziness, tingling, tremors, sensory change, speech change, focal weakness, seizures, loss of consciousness, weakness and headaches.   Endo/Heme/Allergies: Negative for environmental allergies and polydipsia. Does not bruise/bleed easily.  Psychiatric/Behavioral: Negative for depression, suicidal ideas, hallucinations, memory loss and substance abuse. The patient is not nervous/anxious and does not have insomnia.        Objective:  Blood pressure (!) 144/93, pulse (!) 101, height 5\' 8"  (1.727 m), weight (!) 310 lb (140.6 kg), last menstrual period 10/19/2020.   Physical Exam  Vitals reviewed. Constitutional: She is oriented to person, place, and  time. She appears well-developed and well-nourished.  HENT:  Head: Normocephalic and atraumatic.        Right Ear: External ear normal.  Left Ear: External ear normal.  Nose: Nose normal.  Mouth/Throat: Oropharynx is clear and moist.  Eyes: Conjunctivae and EOM are normal. Pupils are equal, round, and reactive to light. Right eye exhibits no discharge. Left eye exhibits no discharge. No scleral icterus.  Neck: Normal range of motion. Neck supple. No tracheal deviation present. No thyromegaly present.  Cardiovascular: Normal rate, regular rhythm, normal heart sounds and intact distal pulses.  Exam reveals no gallop and no friction rub.   No murmur heard. Respiratory: Effort normal and breath sounds normal. No respiratory distress. She has no wheezes. She has no rales. She exhibits no tenderness.  GI: Soft. Bowel sounds are normal. She exhibits no distension and no mass. There is no tenderness. There is no rebound and no guarding.  Genitourinary:  Breasts no masses skin changes or nipple changes bilaterally      Vulva is normal without lesions, candida Vagina is pink moist without discharge, candida Cervix normal in appearance and pap is done Uterus is normal size shape and contour Adnexa is negative with normal sized ovaries   Musculoskeletal: Normal range of motion. She exhibits no edema and no tenderness.  Neurological: She is alert and oriented to person, place, and time.  She has normal reflexes. She displays normal reflexes. No cranial nerve deficit. She exhibits normal muscle tone. Coordination normal.  Skin: Skin is warm and dry. No rash noted. No erythema. No pallor.  Psychiatric: She has a normal mood and affect. Her behavior is normal. Judgment and thought content normal.       Medications Ordered at today's visit: Meds ordered this encounter  Medications   fluconazole (DIFLUCAN) 100 MG tablet    Sig: 1 tablet every other day til all taken    Dispense:  14 tablet    Refill:  0    Other orders placed at today's visit: No orders of the defined types were placed in this encounter.     Assessment:    Normal Gyn exam.   Candida vulvovaginitis due to hyperglycemia Plan:    Contraception: abstinence. Follow up in: 3 years. Painted with GV and 1 month of qod diflucan      No follow-ups on file.

## 2020-11-11 NOTE — Addendum Note (Signed)
Addended by: Moss Mc on: 11/11/2020 11:45 AM   Modules accepted: Orders

## 2020-11-13 ENCOUNTER — Other Ambulatory Visit: Payer: Self-pay

## 2020-11-13 LAB — CYTOLOGY - PAP
Chlamydia: NEGATIVE
Comment: NEGATIVE
Comment: NEGATIVE
Comment: NORMAL
Diagnosis: NEGATIVE
High risk HPV: NEGATIVE
Neisseria Gonorrhea: NEGATIVE

## 2020-11-15 ENCOUNTER — Other Ambulatory Visit (HOSPITAL_COMMUNITY): Payer: Self-pay

## 2020-11-18 ENCOUNTER — Other Ambulatory Visit (HOSPITAL_COMMUNITY): Payer: Self-pay

## 2020-11-20 ENCOUNTER — Other Ambulatory Visit (HOSPITAL_COMMUNITY): Payer: Self-pay

## 2020-11-20 ENCOUNTER — Other Ambulatory Visit: Payer: Self-pay | Admitting: Family Medicine

## 2020-11-20 DIAGNOSIS — I10 Essential (primary) hypertension: Secondary | ICD-10-CM

## 2020-11-20 DIAGNOSIS — E782 Mixed hyperlipidemia: Secondary | ICD-10-CM

## 2020-11-20 DIAGNOSIS — E119 Type 2 diabetes mellitus without complications: Secondary | ICD-10-CM

## 2020-11-22 ENCOUNTER — Other Ambulatory Visit (HOSPITAL_COMMUNITY): Payer: Self-pay

## 2020-11-22 MED ORDER — METFORMIN HCL 1000 MG PO TABS
1000.0000 mg | ORAL_TABLET | Freq: Two times a day (BID) | ORAL | 3 refills | Status: DC
  Filled 2020-11-22 – 2020-12-04 (×2): qty 180, 90d supply, fill #0
  Filled 2021-03-10 – 2021-03-18 (×3): qty 180, 90d supply, fill #1
  Filled 2021-06-17: qty 180, 90d supply, fill #2
  Filled 2021-09-29 – 2021-10-10 (×2): qty 180, 90d supply, fill #3

## 2020-11-22 MED ORDER — AMLODIPINE BESYLATE 5 MG PO TABS
5.0000 mg | ORAL_TABLET | Freq: Every day | ORAL | 3 refills | Status: DC
Start: 2020-11-22 — End: 2022-01-05
  Filled 2020-11-22 – 2020-12-04 (×2): qty 90, 90d supply, fill #0
  Filled 2021-03-10 – 2021-03-18 (×3): qty 90, 90d supply, fill #1
  Filled 2021-06-17: qty 90, 90d supply, fill #2
  Filled 2021-09-29 – 2021-10-10 (×2): qty 90, 90d supply, fill #3

## 2020-11-22 MED ORDER — ATORVASTATIN CALCIUM 20 MG PO TABS
20.0000 mg | ORAL_TABLET | Freq: Every day | ORAL | 3 refills | Status: DC
  Filled 2020-11-22 – 2020-12-04 (×2): qty 90, 90d supply, fill #0
  Filled 2021-03-10 – 2021-03-18 (×3): qty 90, 90d supply, fill #1
  Filled 2021-06-17: qty 90, 90d supply, fill #2
  Filled 2021-09-29 – 2021-10-10 (×2): qty 90, 90d supply, fill #3

## 2020-11-22 MED ORDER — HYDROCHLOROTHIAZIDE 25 MG PO TABS
12.5000 mg | ORAL_TABLET | Freq: Every day | ORAL | 3 refills | Status: DC
  Filled 2020-11-22 – 2020-12-04 (×2): qty 45, 90d supply, fill #0
  Filled 2021-03-10 – 2021-03-18 (×3): qty 45, 90d supply, fill #1
  Filled 2021-06-17: qty 45, 90d supply, fill #2
  Filled 2021-09-29 – 2021-10-10 (×2): qty 45, 90d supply, fill #3

## 2020-11-22 MED ORDER — LOSARTAN POTASSIUM 50 MG PO TABS
50.0000 mg | ORAL_TABLET | Freq: Every day | ORAL | 3 refills | Status: DC
  Filled 2020-11-22 – 2020-12-04 (×2): qty 90, 90d supply, fill #0
  Filled 2021-03-10 – 2021-03-18 (×3): qty 90, 90d supply, fill #1
  Filled 2021-06-17: qty 90, 90d supply, fill #2
  Filled 2021-09-29 – 2021-10-10 (×3): qty 90, 90d supply, fill #3

## 2020-12-02 ENCOUNTER — Encounter: Payer: No Typology Code available for payment source | Admitting: Family Medicine

## 2020-12-03 ENCOUNTER — Other Ambulatory Visit (HOSPITAL_COMMUNITY): Payer: Self-pay

## 2020-12-04 ENCOUNTER — Other Ambulatory Visit (HOSPITAL_COMMUNITY): Payer: Self-pay

## 2020-12-04 ENCOUNTER — Other Ambulatory Visit: Payer: Self-pay | Admitting: Family Medicine

## 2020-12-04 DIAGNOSIS — E119 Type 2 diabetes mellitus without complications: Secondary | ICD-10-CM

## 2020-12-04 NOTE — Telephone Encounter (Signed)
Patient not currently taking this medication per chart review.

## 2020-12-20 ENCOUNTER — Ambulatory Visit (INDEPENDENT_AMBULATORY_CARE_PROVIDER_SITE_OTHER): Payer: No Typology Code available for payment source | Admitting: Family Medicine

## 2020-12-20 ENCOUNTER — Other Ambulatory Visit: Payer: Self-pay

## 2020-12-20 ENCOUNTER — Encounter: Payer: Self-pay | Admitting: Family Medicine

## 2020-12-20 ENCOUNTER — Other Ambulatory Visit (HOSPITAL_COMMUNITY): Payer: Self-pay

## 2020-12-20 VITALS — BP 147/89 | HR 96 | Ht 68.0 in | Wt 310.2 lb

## 2020-12-20 DIAGNOSIS — Z794 Long term (current) use of insulin: Secondary | ICD-10-CM

## 2020-12-20 DIAGNOSIS — E782 Mixed hyperlipidemia: Secondary | ICD-10-CM | POA: Diagnosis not present

## 2020-12-20 DIAGNOSIS — E119 Type 2 diabetes mellitus without complications: Secondary | ICD-10-CM | POA: Diagnosis not present

## 2020-12-20 DIAGNOSIS — E1169 Type 2 diabetes mellitus with other specified complication: Secondary | ICD-10-CM | POA: Diagnosis not present

## 2020-12-20 DIAGNOSIS — E785 Hyperlipidemia, unspecified: Secondary | ICD-10-CM

## 2020-12-20 DIAGNOSIS — I10 Essential (primary) hypertension: Secondary | ICD-10-CM

## 2020-12-20 LAB — POCT GLYCOSYLATED HEMOGLOBIN (HGB A1C): HbA1c, POC (controlled diabetic range): 10.5 % — AB (ref 0.0–7.0)

## 2020-12-20 MED ORDER — EMPAGLIFLOZIN 10 MG PO TABS
10.0000 mg | ORAL_TABLET | Freq: Every day | ORAL | 3 refills | Status: DC
Start: 2020-12-20 — End: 2022-01-05
  Filled 2020-12-20: qty 90, 90d supply, fill #0
  Filled 2021-03-18: qty 90, 90d supply, fill #1
  Filled 2021-06-17: qty 90, 90d supply, fill #2
  Filled 2021-09-29 – 2021-10-10 (×2): qty 90, 90d supply, fill #3

## 2020-12-20 NOTE — Progress Notes (Signed)
   Type 2 DM Last A1c 10.7 about a year ago. Tries to make homemade foods but snacks a lot, describes diet as not the best. Hard to eat a balanced diet because she works at night. Drinks juices and sodas occasionally, did not have any sodas over the month of October but drinks juice. Tries to eat 1-2 vegetables daily, likes carbohydrates but feels she controls this. Will eat candy occasionally. Checks glucose levels at home at least daily, ranges between 90 to upper 200s, so varies quite a bit. Denies hypoglycemic episodes. Does not exercise. Compliant on current regimen of metformin 1000 bid, semglee 40 units in the morning and 15 units at bedtime along with 1 mg ozempic weekly. Reports also being prescribed jardiance previously but has run out of it a few months ago so has not been taking it the last 3 months. Has seen the ophthalmologist a few months ago.   Hypertension Compliant HCTZ, losartan and amlodipine. Denies chest pain, dyspnea and leg swelling. Does not check BP at home regularly.   HLD  Off statin for almost a month due to antibiotic gynecologist put her on. Restarted last week, doing well on it without complications. Work on eating a balanced diet and exercising as she has not made a concrete habit of these thus far.   OBJECTIVE:   BP (!) 147/89   Pulse 96   Ht 5\' 8"  (1.727 m)   Wt (!) 310 lb 4 oz (140.7 kg)   LMP 12/12/2020   SpO2 100%   BMI 47.17 kg/m   General: Patient well-appearing, in no acute distress. CV: RRR, no murmurs or gallops auscultated Resp: CTAB Abdomen: soft, nontender, presence of bowel sounds Ext: radial and distal pulses strong and equal bilaterally, no LE edema noted bilaterally, normal foot exam without wounds, gross sensation intact bilaterally, microfilament testing normal bilaterally, reflexes normal  Neuro: normal gait Psych: mood appropriate  ASSESSMENT/PLAN:   Hypertension -BP 147/89, slightly above goal -awaiting BMP to monitor electrolytes  with current HCTZ therapy  -diet and exercise counseling provided -instructed to monitor BP closely, maintain log and bring to next visit -continue with current antihypertensive regimen    Diabetes mellitus type 2, insulin dependent (HCC) -A1c 10.5, little improvement from last reading about a year ago, not at goal of <7 -added jardiance 10 mg daily in addition to existing diabetic regimen -referred to pharmacy for assistance in better glucose control as patient would likely benefit from mealtime coverage -nutrition handout provided -follow up in 3 months for repeat A1c and to make adjustments as appropriate   Hyperlipidemia associated with type 2 diabetes mellitus (HCC) -most recent lipid panel about 2 years ago unremarkable, pending repeat lipid panel -instructed to continue statin and encouraged daily compliance  -diet and exercise lifestyle modifications extensively discussed      12/14/2020, DO [facility]

## 2020-12-20 NOTE — Assessment & Plan Note (Addendum)
-  BP 147/89, slightly above goal -awaiting BMP to monitor electrolytes with current HCTZ therapy  -diet and exercise counseling provided -instructed to monitor BP closely, maintain log and bring to next visit -continue with current antihypertensive regimen

## 2020-12-20 NOTE — Assessment & Plan Note (Signed)
-  A1c 10.5, little improvement from last reading about a year ago, not at goal of <7 -added jardiance 10 mg daily in addition to existing diabetic regimen -referred to pharmacy for assistance in better glucose control as patient would likely benefit from mealtime coverage -nutrition handout provided -follow up in 3 months for repeat A1c and to make adjustments as appropriate

## 2020-12-20 NOTE — Assessment & Plan Note (Signed)
-  most recent lipid panel about 2 years ago unremarkable, pending repeat lipid panel -instructed to continue statin and encouraged daily compliance  -diet and exercise lifestyle modifications extensively discussed

## 2020-12-20 NOTE — Patient Instructions (Addendum)
It was great seeing you today!  Your A1c has not improved much, I have prescribed jardiance 10 mg, please take this daily as prescribed. Also please make sure to take the rest of your medications as prescribed. Make sure to eat balanced diet and exercise at least 3 times a week.  Please make sure to make an appointment with the eye doctor as soon as possible.   Keep taking your amlodipine, losartan and hydrochlorothiazide for blood pressure. Also take your statin daily.  I will let you know of any abnormal results from the blood work we did today.   Please follow up at your next scheduled appointment in 3 months, if anything arises between now and then, please don't hesitate to contact our office.   Thank you for allowing [place] to be a part of your medical care!  Thank you, Dr. Robyne Peers

## 2020-12-21 LAB — BASIC METABOLIC PANEL
BUN/Creatinine Ratio: 17 (ref 9–23)
BUN: 17 mg/dL (ref 6–20)
CO2: 21 mmol/L (ref 20–29)
Calcium: 9.6 mg/dL (ref 8.7–10.2)
Chloride: 101 mmol/L (ref 96–106)
Creatinine, Ser: 1 mg/dL (ref 0.57–1.00)
Glucose: 186 mg/dL — ABNORMAL HIGH (ref 70–99)
Potassium: 4 mmol/L (ref 3.5–5.2)
Sodium: 139 mmol/L (ref 134–144)
eGFR: 74 mL/min/{1.73_m2} (ref >59)

## 2020-12-21 LAB — LIPID PANEL
Chol/HDL Ratio: 2.9 ratio (ref 0.0–4.4)
Cholesterol, Total: 162 mg/dL (ref 100–199)
HDL: 56 mg/dL (ref >39)
LDL Chol Calc (NIH): 88 mg/dL (ref 0–99)
Triglycerides: 97 mg/dL (ref 0–149)
VLDL Cholesterol Cal: 18 mg/dL (ref 5–40)

## 2020-12-26 ENCOUNTER — Telehealth: Payer: Self-pay | Admitting: Student-PharmD

## 2020-12-26 NOTE — Telephone Encounter (Signed)
Received referral from Dr. Robyne Peers to see patient for DM management. Called patient to schedule appt but was unable to reach, LVM requesting call back. Also routing to admin pool to follow up to get her scheduled for appt.

## 2020-12-30 ENCOUNTER — Other Ambulatory Visit (HOSPITAL_COMMUNITY): Payer: Self-pay

## 2020-12-30 ENCOUNTER — Telehealth: Payer: Self-pay

## 2020-12-30 NOTE — Telephone Encounter (Signed)
Patient calls nurse line stating she picked up Jardiance prescription and realized it was written for 10mg  verses 25mg . Patient reports 25mg  is what she was previously taking and thought that is what would be called in. Will forward to PCP for advisement.

## 2020-12-31 ENCOUNTER — Telehealth: Payer: Self-pay | Admitting: Pharmacist

## 2020-12-31 NOTE — Telephone Encounter (Signed)
Noted and agree. 

## 2020-12-31 NOTE — Telephone Encounter (Signed)
-----   Message from Reece Leader, DO sent at 12/20/2020 12:06 PM EDT ----- Good morning,  Just saw this patient this morning, A1c not improved. I feel she would benefit from further diabetic regimen assistance, possibly adding mealtime coverage. I have added jardiance 10 mg to her current regimen. Appreciate assistance, I will follow up with patient in 3 months for repeat A1c.   Thank you, Anupa Robyne Peers

## 2020-12-31 NOTE — Telephone Encounter (Signed)
Phone call to patient RE diabetes.    Left HIPAA compliant voice mail requesting call back to schedule appoint in [facility] for DM folllow-up.  Asked to bring all medications to that visit.    Front office is also trying to reach out and schedule follow-up.    Total time with patient call and documentation of interaction: 7 minutes.

## 2020-12-31 NOTE — Telephone Encounter (Signed)
LVM

## 2021-03-10 ENCOUNTER — Other Ambulatory Visit (HOSPITAL_COMMUNITY): Payer: Self-pay

## 2021-03-11 ENCOUNTER — Other Ambulatory Visit (HOSPITAL_COMMUNITY): Payer: Self-pay

## 2021-03-13 ENCOUNTER — Other Ambulatory Visit (HOSPITAL_COMMUNITY): Payer: Self-pay

## 2021-03-19 ENCOUNTER — Other Ambulatory Visit (HOSPITAL_COMMUNITY): Payer: Self-pay

## 2021-06-17 ENCOUNTER — Other Ambulatory Visit (HOSPITAL_COMMUNITY): Payer: Self-pay

## 2021-06-24 ENCOUNTER — Other Ambulatory Visit (HOSPITAL_COMMUNITY): Payer: Self-pay

## 2021-07-22 ENCOUNTER — Encounter: Payer: Self-pay | Admitting: *Deleted

## 2021-09-29 ENCOUNTER — Other Ambulatory Visit (HOSPITAL_COMMUNITY): Payer: Self-pay

## 2021-10-08 ENCOUNTER — Other Ambulatory Visit (HOSPITAL_COMMUNITY): Payer: Self-pay

## 2021-10-10 ENCOUNTER — Other Ambulatory Visit: Payer: Self-pay | Admitting: Family Medicine

## 2021-10-10 ENCOUNTER — Other Ambulatory Visit (HOSPITAL_COMMUNITY): Payer: Self-pay

## 2021-10-10 DIAGNOSIS — E119 Type 2 diabetes mellitus without complications: Secondary | ICD-10-CM

## 2021-10-10 MED ORDER — INSULIN GLARGINE-YFGN 100 UNIT/ML ~~LOC~~ SOPN
42.0000 [IU] | PEN_INJECTOR | Freq: Every day | SUBCUTANEOUS | 11 refills | Status: DC
  Filled 2021-10-10: qty 3, 7d supply, fill #0

## 2021-10-14 ENCOUNTER — Other Ambulatory Visit: Payer: Self-pay | Admitting: Family Medicine

## 2021-10-14 ENCOUNTER — Other Ambulatory Visit (HOSPITAL_COMMUNITY): Payer: Self-pay

## 2021-10-14 DIAGNOSIS — E119 Type 2 diabetes mellitus without complications: Secondary | ICD-10-CM

## 2021-10-14 MED ORDER — INSULIN GLARGINE-YFGN 100 UNIT/ML ~~LOC~~ SOPN
42.0000 [IU] | PEN_INJECTOR | Freq: Every day | SUBCUTANEOUS | 6 refills | Status: DC
  Filled 2021-10-14: qty 12, 28d supply, fill #0
  Filled 2021-11-09 – 2022-01-05 (×2): qty 12, 28d supply, fill #1

## 2021-10-15 ENCOUNTER — Other Ambulatory Visit (HOSPITAL_COMMUNITY): Payer: Self-pay

## 2021-11-10 ENCOUNTER — Other Ambulatory Visit (HOSPITAL_COMMUNITY): Payer: Self-pay

## 2022-01-05 ENCOUNTER — Other Ambulatory Visit: Payer: Self-pay | Admitting: Family Medicine

## 2022-01-05 DIAGNOSIS — E119 Type 2 diabetes mellitus without complications: Secondary | ICD-10-CM

## 2022-01-05 DIAGNOSIS — I10 Essential (primary) hypertension: Secondary | ICD-10-CM

## 2022-01-06 ENCOUNTER — Other Ambulatory Visit (HOSPITAL_COMMUNITY): Payer: Self-pay

## 2022-01-06 NOTE — Telephone Encounter (Signed)
Contacted the pt to schedule medication f.u. Patient did nit answer so I left her a vm to contact us back to get scheduled.

## 2022-01-07 ENCOUNTER — Other Ambulatory Visit (HOSPITAL_COMMUNITY): Payer: Self-pay

## 2022-01-07 MED ORDER — EMPAGLIFLOZIN 10 MG PO TABS
10.0000 mg | ORAL_TABLET | Freq: Every day | ORAL | 3 refills | Status: DC
  Filled 2022-01-07: qty 30, 30d supply, fill #0
  Filled 2022-04-07: qty 30, 30d supply, fill #1

## 2022-01-07 MED ORDER — LOSARTAN POTASSIUM 50 MG PO TABS
50.0000 mg | ORAL_TABLET | Freq: Every day | ORAL | 3 refills | Status: DC
  Filled 2022-01-07: qty 30, 30d supply, fill #0
  Filled 2022-04-07: qty 30, 30d supply, fill #1
  Filled 2022-05-07: qty 30, 30d supply, fill #2
  Filled 2022-06-05: qty 30, 30d supply, fill #3
  Filled 2022-07-09 – 2022-07-10 (×2): qty 30, 30d supply, fill #4
  Filled 2022-08-06: qty 30, 30d supply, fill #5
  Filled 2022-09-04 – 2022-09-07 (×2): qty 30, 30d supply, fill #6
  Filled 2022-10-05: qty 30, 30d supply, fill #7
  Filled 2022-11-04: qty 30, 30d supply, fill #8
  Filled 2022-12-02: qty 30, 30d supply, fill #9
  Filled 2023-01-01: qty 30, 30d supply, fill #10

## 2022-01-07 MED ORDER — HYDROCHLOROTHIAZIDE 25 MG PO TABS
12.5000 mg | ORAL_TABLET | Freq: Every day | ORAL | 3 refills | Status: DC
  Filled 2022-01-07: qty 15, 30d supply, fill #0
  Filled 2022-04-07: qty 15, 30d supply, fill #1
  Filled 2022-05-07: qty 15, 30d supply, fill #2
  Filled 2022-06-05: qty 15, 30d supply, fill #3
  Filled 2022-07-09 – 2022-07-10 (×2): qty 15, 30d supply, fill #4
  Filled 2022-08-06: qty 15, 30d supply, fill #5
  Filled 2022-09-04 – 2022-09-07 (×2): qty 15, 30d supply, fill #6
  Filled 2022-10-05: qty 15, 30d supply, fill #7
  Filled 2022-11-04: qty 15, 30d supply, fill #8
  Filled 2022-12-02: qty 15, 30d supply, fill #9
  Filled 2023-01-01: qty 15, 30d supply, fill #10

## 2022-01-07 MED ORDER — AMLODIPINE BESYLATE 5 MG PO TABS
5.0000 mg | ORAL_TABLET | Freq: Every day | ORAL | 3 refills | Status: DC
  Filled 2022-01-07: qty 30, 30d supply, fill #0
  Filled 2022-04-07: qty 30, 30d supply, fill #1
  Filled 2022-05-07: qty 30, 30d supply, fill #2
  Filled 2022-06-05: qty 30, 30d supply, fill #3
  Filled 2022-07-09 – 2022-07-10 (×2): qty 30, 30d supply, fill #4
  Filled 2022-08-06: qty 30, 30d supply, fill #5
  Filled 2022-09-04 – 2022-09-07 (×2): qty 30, 30d supply, fill #6
  Filled 2022-10-05: qty 30, 30d supply, fill #7
  Filled 2022-11-04: qty 30, 30d supply, fill #8
  Filled 2022-12-02: qty 30, 30d supply, fill #9
  Filled 2023-01-01: qty 30, 30d supply, fill #10

## 2022-01-07 MED ORDER — METFORMIN HCL 1000 MG PO TABS
1000.0000 mg | ORAL_TABLET | Freq: Two times a day (BID) | ORAL | 3 refills | Status: DC
  Filled 2022-01-07: qty 60, 30d supply, fill #0
  Filled 2022-04-07: qty 60, 30d supply, fill #1

## 2022-01-09 ENCOUNTER — Other Ambulatory Visit (HOSPITAL_COMMUNITY): Payer: Self-pay

## 2022-01-12 ENCOUNTER — Telehealth: Payer: Self-pay

## 2022-01-12 ENCOUNTER — Other Ambulatory Visit (HOSPITAL_COMMUNITY): Payer: Self-pay

## 2022-01-12 NOTE — Telephone Encounter (Signed)
Rec'd PA request from pt's pharmacy for [facility]. Medication is non formulary.  Insurance prefers: Immunologist  Could one of the preferred medications be sent to pt's pharmacy?

## 2022-01-13 ENCOUNTER — Other Ambulatory Visit: Payer: Self-pay | Admitting: Family Medicine

## 2022-01-13 DIAGNOSIS — E119 Type 2 diabetes mellitus without complications: Secondary | ICD-10-CM

## 2022-01-13 MED ORDER — INSULIN DETEMIR 100 UNIT/ML FLEXPEN: 42.0000 [IU] | PEN_INJECTOR | Freq: Every day | SUBCUTANEOUS | Status: DC

## 2022-04-07 ENCOUNTER — Other Ambulatory Visit: Payer: Self-pay

## 2022-04-07 ENCOUNTER — Other Ambulatory Visit: Payer: Self-pay | Admitting: Family Medicine

## 2022-04-07 ENCOUNTER — Other Ambulatory Visit (HOSPITAL_COMMUNITY): Payer: Self-pay

## 2022-04-07 DIAGNOSIS — E782 Mixed hyperlipidemia: Secondary | ICD-10-CM

## 2022-04-08 ENCOUNTER — Other Ambulatory Visit: Payer: Self-pay

## 2022-04-10 ENCOUNTER — Other Ambulatory Visit (HOSPITAL_COMMUNITY): Payer: Self-pay

## 2022-04-10 ENCOUNTER — Other Ambulatory Visit: Payer: Self-pay

## 2022-04-10 ENCOUNTER — Ambulatory Visit: Payer: 59 | Admitting: Family Medicine

## 2022-04-10 ENCOUNTER — Encounter: Payer: Self-pay | Admitting: Family Medicine

## 2022-04-10 ENCOUNTER — Other Ambulatory Visit: Payer: Self-pay | Admitting: Family Medicine

## 2022-04-10 ENCOUNTER — Ambulatory Visit (INDEPENDENT_AMBULATORY_CARE_PROVIDER_SITE_OTHER): Payer: 59 | Admitting: Family Medicine

## 2022-04-10 VITALS — BP 192/108 | HR 99 | Ht 68.0 in | Wt 313.4 lb

## 2022-04-10 DIAGNOSIS — E119 Type 2 diabetes mellitus without complications: Secondary | ICD-10-CM

## 2022-04-10 DIAGNOSIS — J4520 Mild intermittent asthma, uncomplicated: Secondary | ICD-10-CM

## 2022-04-10 DIAGNOSIS — Z794 Long term (current) use of insulin: Secondary | ICD-10-CM | POA: Diagnosis not present

## 2022-04-10 LAB — POCT GLYCOSYLATED HEMOGLOBIN (HGB A1C): HbA1c, POC (controlled diabetic range): 11 % — AB (ref 0.0–7.0)

## 2022-04-10 MED ORDER — TRULICITY 0.75 MG/0.5ML ~~LOC~~ SOAJ
0.7500 mg | SUBCUTANEOUS | 5 refills | Status: DC
  Filled 2022-04-10: qty 2, 28d supply, fill #0
  Filled 2022-05-07: qty 2, 28d supply, fill #1

## 2022-04-10 MED ORDER — OZEMPIC (0.25 OR 0.5 MG/DOSE) 2 MG/1.5ML ~~LOC~~ SOPN
0.5000 mg | PEN_INJECTOR | SUBCUTANEOUS | 6 refills | Status: DC
  Filled 2022-04-10: qty 3, 56d supply, fill #0

## 2022-04-10 MED ORDER — EMPAGLIFLOZIN 25 MG PO TABS
25.0000 mg | ORAL_TABLET | Freq: Every day | ORAL | 3 refills | Status: DC
  Filled 2022-04-10: qty 30, 30d supply, fill #0
  Filled 2022-05-07: qty 30, 30d supply, fill #1
  Filled 2022-06-05: qty 30, 30d supply, fill #2
  Filled 2022-07-09 – 2022-07-14 (×5): qty 30, 30d supply, fill #3
  Filled 2022-08-06 – 2022-08-07 (×3): qty 30, 30d supply, fill #4
  Filled 2022-09-04 – 2022-09-07 (×2): qty 30, 30d supply, fill #5
  Filled 2022-10-05: qty 30, 30d supply, fill #6
  Filled 2022-11-04: qty 30, 30d supply, fill #7
  Filled 2022-12-02: qty 30, 30d supply, fill #8
  Filled 2023-01-01: qty 30, 30d supply, fill #9

## 2022-04-10 MED ORDER — ALBUTEROL SULFATE HFA 108 (90 BASE) MCG/ACT IN AERS
2.0000 | INHALATION_SPRAY | Freq: Four times a day (QID) | RESPIRATORY_TRACT | 2 refills | Status: AC | PRN
  Filled 2022-04-10: qty 6.7, 25d supply, fill #0
  Filled 2023-02-25: qty 6.7, 25d supply, fill #1

## 2022-04-10 MED ORDER — OZEMPIC (0.25 OR 0.5 MG/DOSE) 2 MG/3ML ~~LOC~~ SOPN
0.2500 mg | PEN_INJECTOR | SUBCUTANEOUS | 6 refills | Status: DC
  Filled 2022-04-10: qty 3, 28d supply, fill #0

## 2022-04-10 NOTE — Assessment & Plan Note (Signed)
-  albuterol refills provided

## 2022-04-10 NOTE — Assessment & Plan Note (Signed)
-  BP very elevated and remained so on repeat -continue losartan and amlodipine, patient is currently abstinent and no concern for pregnancy given LMP 2/17/024 but we discussed starting on birth control when she is sexually active as the ARB can harm baby. She agreed and voiced understanding of this -increase HCTZ to 25 mg daily -lifestyle modifications -ED precautions  -follow up in 1 week, repeat BMP at this time

## 2022-04-10 NOTE — Progress Notes (Signed)
    SUBJECTIVE:   CHIEF COMPLAINT / HPI:   Patient presents for hypertension and DM follow up. She has run out of her inhaler and has intermittent shortness of breath with a lot of exertion. Denies chest pain. Sometimes she feels that she has blurry vision intermittently with tension headaches. Last saw the eye doctor last year, she has not had the chance to schedule, last visit was Feb 2023. Patient compliant on amlodipine, HCTZ and losartan. LMP 2/17/024. Glucose levels have been in high 200s at times with highest in the 300s. Compliant on DM regimen of jardiance 10 mg (was on 25 mg in the past) and metformin 100 mg bid. 42 units daily and 15 units at night. Not able to get her ozempic due to refill issues.   OBJECTIVE:   BP (!) 185/99   Pulse 99   Ht 5' 8"$  (1.727 m)   Wt (!) 313 lb 6.4 oz (142.2 kg)   LMP 04/04/2022   SpO2 100%   BMI 47.65 kg/m   General: Patient well-appearing, in no acute distress. CV: RRR, no murmurs or gallops auscultated Resp: CTAB, no wheezing, rales or rhonchi noted, breathing comfortably on room air without signs of respiratory distress Ext: normal foot exam  ASSESSMENT/PLAN:   Hypertension -BP very elevated and remained so on repeat -continue losartan and amlodipine, patient is currently abstinent and no concern for pregnancy given LMP 2/17/024 but we discussed starting on birth control when she is sexually active as the ARB can harm baby. She agreed and voiced understanding of this -increase HCTZ to 25 mg daily -lifestyle modifications -ED precautions  -follow up in 1 week, repeat BMP at this time  Diabetes mellitus type 2, insulin dependent (HCC) -A1c 11, not at goal of 7-8 -increased jardiance to 25 mg daily -restarting ozempic 0.25 mg weekly with plan to titrate up to 0.5 mg in 4 weeks, discussed with patient -continue current insulin regimen, consider increasing at future visit but wanted to avoid today given other changes -foot exam performed  today -advised to schedule routine follow up with ophthalmologist at her earliest convenience -follow up in 1 month for DM check and then repeat A1c in 3 months  Asthma -albuterol refills provided      Donney Dice, [place]

## 2022-04-10 NOTE — Assessment & Plan Note (Signed)
-  A1c 11, not at goal of 7-8 -increased jardiance to 25 mg daily -restarting ozempic 0.25 mg weekly with plan to titrate up to 0.5 mg in 4 weeks, discussed with patient -continue current insulin regimen, consider increasing at future visit but wanted to avoid today given other changes -foot exam performed today -advised to schedule routine follow up with ophthalmologist at her earliest convenience -follow up in 1 month for DM check and then repeat A1c in 3 months

## 2022-04-10 NOTE — Patient Instructions (Signed)
It was great seeing you today!  Today we discussed your blood pressure and diabetes. Your blood pressure was high, we will increase HCTZ to 25 mg, please take the entire tablet starting today along with your losartan and amlodipine. Please check your blood pressures twice daily and record this, bring this to your next visit.   Your A1c is 11, I have restarted you on ozempic 0.25 mg every week. We will also increase your jardiance to 25 mg daily.   Please schedule an appointment with your eye doctor at your earliest convenience.   Please follow up at your next scheduled appointment in 1 week, if anything arises between now and then, please don't hesitate to contact our office.   Thank you for allowing [place] to be a part of your medical care!  Thank you, Dr. Larae Grooms  Also a reminder of our clinic's no-show policy. Please make sure to arrive at least 15 minutes prior to your scheduled appointment time. Please try to cancel before 24 hours if you are not able to make it. If you no-show for 2 appointments then you will be receiving a warning letter. If you no-show after 3 visits, then you may be at risk of being dismissed from our clinic. This is to ensure that everyone is able to be seen in a timely manner. Thank you, we appreciate your assistance with this!

## 2022-04-14 ENCOUNTER — Encounter: Payer: Self-pay | Admitting: Family Medicine

## 2022-04-14 ENCOUNTER — Ambulatory Visit (INDEPENDENT_AMBULATORY_CARE_PROVIDER_SITE_OTHER): Payer: 59 | Admitting: Family Medicine

## 2022-04-14 VITALS — BP 131/78 | HR 116 | Ht 68.0 in | Wt 311.4 lb

## 2022-04-14 DIAGNOSIS — Z794 Long term (current) use of insulin: Secondary | ICD-10-CM

## 2022-04-14 DIAGNOSIS — I10 Essential (primary) hypertension: Secondary | ICD-10-CM

## 2022-04-14 DIAGNOSIS — E119 Type 2 diabetes mellitus without complications: Secondary | ICD-10-CM | POA: Diagnosis not present

## 2022-04-14 NOTE — Patient Instructions (Signed)
It was great seeing you today!  Today we discussed your blood pressure which looks great! Please continue taking all your medications, we will not make any changes. Please monitor your BP and bring this into your next visit with me to see if we do need to make changes in the future.  Please follow up with Dr. Valentina Lucks on 3/1 at 11:15 am for a diabetes follow up to get the continuous glucose monitoring. In the meantime, please check your sugar levels and bring in this record into your visit with Dr. Valentina Lucks.   Please follow up with me in 1 month, we can see how we can further adjust your insulin if needed.   Please follow up at your next scheduled appointment in 1 month, if anything arises between now and then, please don't hesitate to contact our office.   Thank you for allowing [place] to be a part of your medical care!  Thank you, Dr. Larae Grooms  Also a reminder of our clinic's no-show policy. Please make sure to arrive at least 15 minutes prior to your scheduled appointment time. Please try to cancel before 24 hours if you are not able to make it. If you no-show for 2 appointments then you will be receiving a warning letter. If you no-show after 3 visits, then you may be at risk of being dismissed from our clinic. This is to ensure that everyone is able to be seen in a timely manner. Thank you, we appreciate your assistance with this!

## 2022-04-14 NOTE — Progress Notes (Signed)
    SUBJECTIVE:   CHIEF COMPLAINT / HPI:   Patient with history of type 2 DM and hypertension presents for BP follow up. Elevated BP during last visit and instructed to increase HCTZ to 25 mg, however patient says that she somehow misunderstood with all that we discussed last time. However, she has made significant changes such as walking a lot and juicing vegetables. Has been checking her BP at home and it has been appropriate. She believes that during our last visit, it may have been other environmental factors that influenced her BP as she had a lot of ongoing family stressors with her brother, aunt and uncle all in the hospital at that time. Endorses compliance on her antihypertensive regimen as well as her DM regimen.   OBJECTIVE:   BP 131/78   Pulse (!) 116   Ht 5' 8"$  (1.727 m)   Wt (!) 311 lb 6.4 oz (141.3 kg)   LMP 04/04/2022   SpO2 99%   BMI 47.35 kg/m   General: Patient well-appearing, in no acute distress. CV: RRR, no murmurs or gallops auscultated Resp: CTAB, no wheezing, rales or rhonchi noted  ASSESSMENT/PLAN:   Hypertension -BP 131/78, at goal -continue current antihypertensive regimen, with HCTZ at 12.5 mg dose -continue lifestyle modifications -strict return precautions discussed of when to return earlier  -follow up in 1 month for BP check  Diabetes mellitus type 2, insulin dependent ([place]) -recent A1c 11, patient continues to improve her lifestyle and has increased jardiance to 25 mg as instructed during the last visit. Also starting trulicity as opposed to ozempic due to insurance coverage issues -continue DM regimen as prescribed  -instructed patient to maintain log of glucose levels and bring to next visit so we can adjust her insulin regimen for better DM control. She is interested in CGM, scheduled with Dr. Valentina Lucks on 3/1 to have this done -follow up in 1 month, plan to further adjust insulin regimen as appropriate     Aarushi Hemric Larae Grooms, [place]

## 2022-04-14 NOTE — Assessment & Plan Note (Signed)
-  recent A1c 11, patient continues to improve her lifestyle and has increased jardiance to 25 mg as instructed during the last visit. Also starting trulicity as opposed to ozempic due to insurance coverage issues -continue DM regimen as prescribed  -instructed patient to maintain log of glucose levels and bring to next visit so we can adjust her insulin regimen for better DM control. She is interested in CGM, scheduled with Dr. Valentina Lucks on 3/1 to have this done -follow up in 1 month, plan to further adjust insulin regimen as appropriate

## 2022-04-14 NOTE — Assessment & Plan Note (Signed)
-  BP 131/78, at goal -continue current antihypertensive regimen, with HCTZ at 12.5 mg dose -continue lifestyle modifications -strict return precautions discussed of when to return earlier  -follow up in 1 month for BP check

## 2022-04-17 ENCOUNTER — Ambulatory Visit (INDEPENDENT_AMBULATORY_CARE_PROVIDER_SITE_OTHER): Payer: 59 | Admitting: Pharmacist

## 2022-04-17 ENCOUNTER — Encounter: Payer: Self-pay | Admitting: Pharmacist

## 2022-04-17 ENCOUNTER — Other Ambulatory Visit (HOSPITAL_COMMUNITY): Payer: Self-pay

## 2022-04-17 ENCOUNTER — Other Ambulatory Visit: Payer: Self-pay | Admitting: Family Medicine

## 2022-04-17 VITALS — BP 137/90 | HR 108 | Ht 68.0 in | Wt 309.0 lb

## 2022-04-17 DIAGNOSIS — E119 Type 2 diabetes mellitus without complications: Secondary | ICD-10-CM

## 2022-04-17 DIAGNOSIS — Z794 Long term (current) use of insulin: Secondary | ICD-10-CM

## 2022-04-17 MED ORDER — BASAGLAR KWIKPEN 100 UNIT/ML ~~LOC~~ SOPN
36.0000 [IU] | PEN_INJECTOR | Freq: Every day | SUBCUTANEOUS | 11 refills | Status: DC
  Filled 2022-04-17: qty 15, 41d supply, fill #0
  Filled 2022-05-07: qty 9, 25d supply, fill #1

## 2022-04-17 MED ORDER — FIASP FLEXTOUCH 100 UNIT/ML ~~LOC~~ SOPN
12.0000 [IU] | PEN_INJECTOR | Freq: Two times a day (BID) | SUBCUTANEOUS | 11 refills | Status: DC
  Filled 2022-04-17: qty 15, fill #0

## 2022-04-17 MED ORDER — METFORMIN HCL ER 500 MG PO TB24
1000.0000 mg | ORAL_TABLET | Freq: Two times a day (BID) | ORAL | 3 refills | Status: DC
  Filled 2022-04-17: qty 120, 30d supply, fill #0
  Filled 2022-05-07: qty 120, 30d supply, fill #1
  Filled 2022-06-05: qty 120, 30d supply, fill #2
  Filled 2022-07-09 – 2022-07-10 (×2): qty 120, 30d supply, fill #3
  Filled 2022-08-06: qty 120, 30d supply, fill #4
  Filled 2022-09-04 – 2022-09-07 (×2): qty 120, 30d supply, fill #5

## 2022-04-17 MED ORDER — DEXCOM G7 SENSOR MISC
11 refills | Status: DC
  Filled 2022-04-17: qty 3, 30d supply, fill #0
  Filled 2022-04-28 – 2022-05-07 (×4): qty 3, 30d supply, fill #1
  Filled 2022-06-05: qty 3, 30d supply, fill #2
  Filled 2022-07-09 – 2022-07-10 (×2): qty 3, 30d supply, fill #3
  Filled 2022-08-23: qty 3, 30d supply, fill #4
  Filled 2022-09-21: qty 3, 30d supply, fill #5
  Filled 2022-10-22: qty 3, 30d supply, fill #6
  Filled 2022-11-12 – 2022-11-22 (×2): qty 3, 30d supply, fill #7
  Filled 2022-12-16: qty 3, 30d supply, fill #8
  Filled 2023-01-21: qty 3, 30d supply, fill #9
  Filled 2023-02-25 – 2023-04-06 (×5): qty 3, 30d supply, fill #10

## 2022-04-17 MED ORDER — FIASP FLEXTOUCH 100 UNIT/ML ~~LOC~~ SOPN: 12.0000 [IU] | PEN_INJECTOR | Freq: Two times a day (BID) | SUBCUTANEOUS | 0 refills | Status: DC

## 2022-04-17 NOTE — Patient Instructions (Signed)
It was nice to see you today!  Your goal blood sugar is 80-130 before eating and less than 180 after eating.  Medication Changes: Decrease Basaglar to 36 units once daily ~ 8:30 AM  Begin Fiasp (insulin aspart) 12 units prior to meals twice daily.  Enjoy your Dexcom G7 - please accept the data sharing email invite to share data.     Monitor blood sugars at home and keep a log (glucometer or piece of paper) to bring with you to your next visit.  Keep up the good work with diet and exercise. Aim for a diet full of vegetables, fruit and lean meats (chicken, [place], fish). Try to limit salt intake by eating fresh or frozen vegetables (instead of canned), rinse canned vegetables prior to cooking and do not add any additional salt to meals.

## 2022-04-17 NOTE — Assessment & Plan Note (Signed)
Diabetes longstanding with current poor control due to suboptimal medication regimen.  Tolerating recently initiated Trulcity (dulaglutide)  Patient is able to verbalize appropriate hypoglycemia management plan. Medication adherence appears good.  -Adjusted dose of basal insulin Basaglar (insulin glargine) to 36 units once daily.   -Initiated a trial of rapid insulin Fiasp (insulin aspart) 12 units prior to two meals.  -Continued GLP-1 Trulicity (dulaglutide) at 0.75 mg.  -Continued SGLT2-I Jardiance (empagliflozin) '25mg'$ .  -Continued metformin '1000mg'$  BID.  New Rx for QD or BID XR formulation sent for next fill.  -Patient educated on purpose, proper use, and potential adverse effects. -Extensively discussed pathophysiology of diabetes, recommended lifestyle interventions, dietary effects on blood sugar control.  -Counseled on s/sx of and management of hypoglycemia.   Started Dexcom G7 - initiated in office.  New prescription provided.  Should be covered as patient is now taking basal/bolus insulin.

## 2022-04-17 NOTE — Progress Notes (Signed)
S:     Chief Complaint  Patient presents with   Medication Management    Diabetes   42 y.o. female who presents for diabetes evaluation, education, and management.  PMH is significant for diabetes, hypertension, asthma, and hyperlipidemia. Patient was referred and last seen by Primary Care Provider, Dr. Larae Grooms, on 04/14/2022.   At last visit, patient was initiated on Trulicity (dulaglutide).  She took first dose two days ago and denies any side effects.    Today, patient arrives in good spirits and presents without any assistance.   Patient reports Diabetes was diagnosed with her pregnancy - 17 years ago.   Family/Social History: works rotating night schedule at [employer] [facility].  Works 7AM to [employer] 3 days out of 7.  (Currently working [employer] schedule)  Current diabetes medications include: Trulicity (dulaglutide),  Basaglar (insulin glargine)  42 units in the AM and 15 units at night, Metformin '1000mg'$  IR and Jardiance (empagliflozin) '25mg'$  daily. Current hypertension medications include: amlodipine '5mg'$ , losartan '50mg'$  and HCTZ 12.'5mg'$  daily. Current hyperlipidemia medications include: atorvastatin '20mg'$   Patient reports adherence to taking all medications as prescribed.   Have you been experiencing any side effects to the medications prescribed? no Do you have any problems obtaining medications due to transportation or finances? no Insurance coverage: aetna  Patient denies hypoglycemic events.  Reported home fasting blood sugars: lower 200s, few 300+   Patient reports nocturia (nighttime urination) 1-2x per night  Patient reported dietary habits: Eats 2 larger meals/day - times vary based on work schedule.   Snacks: multiple throughout the day - nuts, chips Drinks: water and admits to drinking "too much juice"   O:   Review of Systems  All other systems reviewed and are negative.   Physical Exam Constitutional:      Appearance: Normal appearance.   Pulmonary:     Effort: Pulmonary effort is normal.  Neurological:     Mental Status: She is alert.  Psychiatric:        Mood and Affect: Mood normal.        Behavior: Behavior normal.        Thought Content: Thought content normal.      Lab Results  Component Value Date   HGBA1C 11.0 (A) 04/10/2022   Vitals:   04/17/22 1201 04/17/22 1208  BP: (!) 140/95 (!) 137/90  Pulse: (!) 108   SpO2: 100%     Lipid Panel     Component Value Date/Time   CHOL 162 12/20/2020 0943   TRIG 97 12/20/2020 0943   HDL 56 12/20/2020 0943   CHOLHDL 2.9 12/20/2020 0943   CHOLHDL 3.6 03/16/2017 1056   LDLCALC 88 12/20/2020 0943   LDLCALC 155 (H) 03/16/2017 1056     A/P: Diabetes longstanding with current poor control due to suboptimal medication regimen.  Tolerating recently initiated Trulcity (dulaglutide)  Patient is able to verbalize appropriate hypoglycemia management plan. Medication adherence appears good.  -Adjusted dose of basal insulin Basaglar (insulin glargine) to 36 units once daily.   -Initiated a trial of rapid insulin Fiasp (insulin aspart) 12 units prior to two meals.  -Continued GLP-1 Trulicity (dulaglutide) at 0.75 mg.  -Continued SGLT2-I Jardiance (empagliflozin) '25mg'$ .  -Continued metformin '1000mg'$  BID.  New Rx for QD or BID XR formulation sent for next fill.  -Patient educated on purpose, proper use, and potential adverse effects. -Extensively discussed pathophysiology of diabetes, recommended lifestyle interventions, dietary effects on blood sugar control.  -Counseled on s/sx of  and management of hypoglycemia.   Started Dexcom G7 - initiated in office.  New prescription provided.  Should be covered as patient is now taking basal/bolus insulin.     Written patient instructions provided. Patient verbalized understanding of treatment plan.  Total time in face to face counseling 40 minutes.    Follow-up:  Pharmacist 2-3 weeks. PCP clinic visit in 1 month.  Patient  seen with Louanne Belton PharmD PGY-1 Pharmacy Resident and Estelle June, PharmD Candidate.

## 2022-04-20 MED ORDER — FIASP FLEXTOUCH 100 UNIT/ML ~~LOC~~ SOPN: 12.0000 [IU] | PEN_INJECTOR | Freq: Two times a day (BID) | SUBCUTANEOUS | 0 refills | Status: DC

## 2022-04-20 NOTE — Progress Notes (Signed)
Reviewed and agree with Dr Graylin Shiver plan.

## 2022-04-21 ENCOUNTER — Other Ambulatory Visit (HOSPITAL_COMMUNITY): Payer: Self-pay

## 2022-04-21 MED ORDER — FIASP FLEXTOUCH 100 UNIT/ML ~~LOC~~ SOPN
12.0000 [IU] | PEN_INJECTOR | Freq: Two times a day (BID) | SUBCUTANEOUS | 0 refills | Status: DC
  Filled 2022-04-21: qty 3, 12d supply, fill #0

## 2022-04-21 NOTE — Addendum Note (Signed)
Addended by: Talbot Grumbling on: 04/21/2022 10:11 AM   Modules accepted: Orders

## 2022-04-21 NOTE — Telephone Encounter (Signed)
Patient calls nurse line regarding fiasp refill.  Refill from yesterday was ordered as sample. Resent electronically to [place].   Talbot Grumbling, RN

## 2022-04-24 ENCOUNTER — Telehealth: Payer: Self-pay

## 2022-04-24 ENCOUNTER — Other Ambulatory Visit (HOSPITAL_COMMUNITY): Payer: Self-pay

## 2022-04-24 ENCOUNTER — Other Ambulatory Visit: Payer: Self-pay | Admitting: Family Medicine

## 2022-04-24 DIAGNOSIS — E119 Type 2 diabetes mellitus without complications: Secondary | ICD-10-CM

## 2022-04-24 MED ORDER — FIASP FLEXTOUCH 100 UNIT/ML ~~LOC~~ SOPN
12.0000 [IU] | PEN_INJECTOR | Freq: Two times a day (BID) | SUBCUTANEOUS | 10 refills | Status: DC
  Filled 2022-04-24 – 2022-05-07 (×2): qty 3, 12d supply, fill #0

## 2022-04-24 NOTE — Telephone Encounter (Signed)
Patient calls nurse line in regards to [facility] refill.   She reports she did receive the refill, however she is requesting more pens.   She reports she does 24 units total daily and one pen will only last her ~ 4 days.   Will forward to PCP.

## 2022-04-28 ENCOUNTER — Other Ambulatory Visit (HOSPITAL_COMMUNITY): Payer: Self-pay

## 2022-04-29 ENCOUNTER — Other Ambulatory Visit (HOSPITAL_COMMUNITY): Payer: Self-pay

## 2022-04-30 ENCOUNTER — Other Ambulatory Visit (HOSPITAL_COMMUNITY): Payer: Self-pay

## 2022-05-01 ENCOUNTER — Telehealth: Payer: Self-pay

## 2022-05-01 NOTE — Telephone Encounter (Signed)
Patient calls nurse line regarding concerns with Dexcom sensor.   She has been having issues with sensors falling off. 1 sensor fell off after showering, the other someone bumped into her and knocked it off.   Reached out to [facility]. She is waiting to hear back from them.   She is asking if we have any dexcom sensors.   Will forward to pharmacy team.   Talbot Grumbling, RN

## 2022-05-01 NOTE — Telephone Encounter (Signed)
Contacted patient RE Dexcom Sensors falling off and need for replacements.   Patient willing to come to visit Monday 3/18 at 9:00 to discuss strategies to keep Dexcom in place.   Appt scheduled.

## 2022-05-04 ENCOUNTER — Ambulatory Visit: Payer: Medicaid Other | Admitting: Pharmacist

## 2022-05-04 NOTE — Telephone Encounter (Signed)
Reviewed and agree with Dr Koval's plan.   

## 2022-05-07 ENCOUNTER — Other Ambulatory Visit: Payer: Self-pay | Admitting: Family Medicine

## 2022-05-07 ENCOUNTER — Other Ambulatory Visit (HOSPITAL_COMMUNITY): Payer: Self-pay

## 2022-05-07 DIAGNOSIS — E782 Mixed hyperlipidemia: Secondary | ICD-10-CM

## 2022-05-07 MED ORDER — ATORVASTATIN CALCIUM 20 MG PO TABS
20.0000 mg | ORAL_TABLET | Freq: Every day | ORAL | 3 refills | Status: DC
  Filled 2022-05-07: qty 30, 30d supply, fill #0
  Filled 2022-06-05: qty 30, 30d supply, fill #1
  Filled 2022-07-09 – 2022-07-10 (×2): qty 30, 30d supply, fill #2
  Filled 2022-08-06: qty 30, 30d supply, fill #3
  Filled 2022-09-04 – 2022-09-07 (×2): qty 30, 30d supply, fill #4
  Filled 2022-10-05: qty 30, 30d supply, fill #5
  Filled 2022-11-04: qty 30, 30d supply, fill #6
  Filled 2022-12-02: qty 30, 30d supply, fill #7
  Filled 2023-01-01: qty 30, 30d supply, fill #8
  Filled 2023-01-29: qty 30, 30d supply, fill #9
  Filled 2023-02-25: qty 30, 30d supply, fill #10
  Filled 2023-03-22 – 2023-04-06 (×2): qty 30, 30d supply, fill #11

## 2022-05-11 ENCOUNTER — Other Ambulatory Visit (HOSPITAL_COMMUNITY): Payer: Self-pay

## 2022-05-11 ENCOUNTER — Ambulatory Visit (INDEPENDENT_AMBULATORY_CARE_PROVIDER_SITE_OTHER): Payer: 59 | Admitting: Pharmacist

## 2022-05-11 ENCOUNTER — Encounter: Payer: Self-pay | Admitting: Pharmacist

## 2022-05-11 ENCOUNTER — Other Ambulatory Visit: Payer: Self-pay

## 2022-05-11 VITALS — BP 139/83 | HR 103 | Wt 312.6 lb

## 2022-05-11 DIAGNOSIS — Z794 Long term (current) use of insulin: Secondary | ICD-10-CM

## 2022-05-11 DIAGNOSIS — E119 Type 2 diabetes mellitus without complications: Secondary | ICD-10-CM | POA: Diagnosis not present

## 2022-05-11 MED ORDER — BASAGLAR KWIKPEN 100 UNIT/ML ~~LOC~~ SOPN
30.0000 [IU] | PEN_INJECTOR | Freq: Every day | SUBCUTANEOUS | 11 refills | Status: DC
  Filled 2022-05-11 – 2022-06-05 (×2): qty 15, 50d supply, fill #0
  Filled 2022-07-09 – 2022-07-11 (×2): qty 15, 50d supply, fill #1
  Filled 2022-08-23: qty 15, 50d supply, fill #2
  Filled 2022-11-04: qty 15, 50d supply, fill #3
  Filled 2022-12-16: qty 15, 50d supply, fill #4
  Filled 2023-01-29: qty 15, 50d supply, fill #5
  Filled 2023-03-22 – 2023-04-23 (×3): qty 15, 50d supply, fill #6

## 2022-05-11 MED ORDER — FIASP FLEXTOUCH 100 UNIT/ML ~~LOC~~ SOPN
10.0000 [IU] | PEN_INJECTOR | Freq: Three times a day (TID) | SUBCUTANEOUS | 10 refills | Status: DC
  Filled 2022-05-11: qty 15, 50d supply, fill #0
  Filled 2022-05-11: qty 9, 30d supply, fill #0
  Filled 2022-06-17 – 2022-07-02 (×2): qty 9, 30d supply, fill #1
  Filled 2022-08-06: qty 9, 30d supply, fill #2
  Filled 2022-09-04 – 2022-09-07 (×2): qty 9, 30d supply, fill #3
  Filled 2022-10-05: qty 9, 30d supply, fill #4
  Filled 2022-11-12: qty 9, 30d supply, fill #5
  Filled 2022-12-10 – 2022-12-25 (×2): qty 9, 30d supply, fill #6

## 2022-05-11 MED ORDER — TRULICITY 1.5 MG/0.5ML ~~LOC~~ SOAJ
1.5000 mg | SUBCUTANEOUS | 5 refills | Status: DC
  Filled 2022-05-11: qty 2, 28d supply, fill #0
  Filled 2022-06-17 – 2022-07-10 (×5): qty 2, 28d supply, fill #1
  Filled 2022-08-23: qty 2, 28d supply, fill #2
  Filled 2022-09-21: qty 2, 28d supply, fill #3
  Filled 2022-10-18: qty 2, 28d supply, fill #4
  Filled 2022-11-12: qty 2, 28d supply, fill #5

## 2022-05-11 NOTE — Telephone Encounter (Signed)
Patient willing to come to clinic today for education on sensor adherence.  Scheduled for 2:30 PM today.

## 2022-05-11 NOTE — Telephone Encounter (Signed)
Patient calls nurse line in regards to [facility] sensors.   She reports she was unable to make her apt with [facility] and apologized for missing.   She reports she will be in the area later and would like to stop by and pick up sensors.   Will forward to [place].

## 2022-05-11 NOTE — Assessment & Plan Note (Signed)
Diabetes diagnosed in 2014 and is much improved in control with bolus insulin addition to her previous regimen. Patient appears adherent and is able to verbalize appropriate hypoglycemia management plan. Control is near optimal based on glucose readings from CGM report. -Decreased dose of basal insulin Basaglar Kwikpen (insulin glargine) from 36 units to 30 units daily in the morning.  -Changed rapid insulin Fiasp FlexTouch (insulin aspart) from 12 units twice daily prior to meals to 10 units before each meal everyday (3 meals) -Increased dose of GLP-1 Trulicity (dulaglutide) from 0.75 mg to 1.5 mg weekly -Continued SGLT2-I Jardiance (empagliflozin) 25 mg. Counseled on sick day rules -Continued metformin 1000 mg BID - Placed new Dexcom G7 Sensor on patient and provided instructions and education on how to prevent it from falling off again. Educated on use of "Skin Tack" and Tegaderm -Extensively discussed pathophysiology of diabetes, recommended lifestyle interventions, dietary effects on blood sugar control.  -Counseled on s/sx of and management of hypoglycemia.

## 2022-05-11 NOTE — Progress Notes (Signed)
S:     Chief Complaint  Patient presents with   Medication Management    T2DM - Dexcom Sensor Adhesion   42 y.o. female who presents for diabetes evaluation, education, and management.  PMH is significant for T2DM.  Patient was referred and last seen by Primary Care Provider, Dr. Larae Grooms, on 04/14/22.  At last visit, Dexcom G7 sensor was placed, Basaglar (insulin glargine) was decreased to 36 units daily, and Fiasp (insulin aspart) 12 units BID before meals was initiated.   Today, patient arrives in good spirits and presents without any assistance. Patient states their Dexcom G7 sensor fell off and requests help with applying a new one and edcuation on how to prevent it from falling off again.  Patient reports Diabetes was diagnosed in 2014.   Current diabetes medications include: Jardiance (empagliflozin) 25 mg, Metformin 1000 mg BID, Fiasp (insulin aspart) 12 units BID before meals, Basglar (insulin glargine) 36 units daily. Current hypertension medications include: Amlodipine 5 mg, Losartan 50 mg, HCTZ 25 mg Current hyperlipidemia medications include: Atorvastatin 20 mg  Patient reports adherence to taking all medications as prescribed.    Do you feel that your medications are working for you? yes Have you been experiencing any side effects to the medications prescribed? no Do you have any problems obtaining medications due to transportation or finances? no Insurance coverage: Medicaid  Patient denies hypoglycemic events.   Patient denies nocturia (nighttime urination).  Patient denies neuropathy (nerve pain). Patient denies visual changes. Patient denies self foot exams.   Patient reported dietary habits: Eats 3 meals/day  Within the past 12 months, did you worry whether your food would run out before you got money to buy more? no Within the past 12 months, did the food you bought run out, and you didn't have money to get more? no   O:   Review of Systems  All  other systems reviewed and are negative.   Physical Exam Constitutional:      Appearance: Normal appearance.  Neurological:     Mental Status: She is alert.  Psychiatric:        Mood and Affect: Mood normal.        Behavior: Behavior normal.        Thought Content: Thought content normal.        Judgment: Judgment normal.     CGM Download:  % Time CGM is active: 97.1% Average Glucose: 173 mg/dL Glucose Management Indicator: 7.4% Glucose Variability: 21.3% (goal <36%) Time in Goal:  - Time in range 70-180: 60% - Time above range: 40% (38% High, 2% Very High) - Time below range: 0%   Lab Results  Component Value Date   HGBA1C 11.0 (A) 04/10/2022   Vitals:   05/11/22 1545  BP: 139/83  Pulse: (!) 103  SpO2: 99%    Lipid Panel     Component Value Date/Time   CHOL 162 12/20/2020 0943   TRIG 97 12/20/2020 0943   HDL 56 12/20/2020 0943   CHOLHDL 2.9 12/20/2020 0943   CHOLHDL 3.6 03/16/2017 1056   LDLCALC 88 12/20/2020 0943   LDLCALC 155 (H) 03/16/2017 1056    Clinical Atherosclerotic Cardiovascular Disease (ASCVD): No    Patient is participating in a Managed Medicaid Plan:  Yes   A/P: Diabetes diagnosed in 2014 and is much improved in control with bolus insulin addition to her previous regimen. Patient appears adherent and is able to verbalize appropriate hypoglycemia management plan. Control is near optimal based  on glucose readings from CGM report. -Decreased dose of basal insulin Basaglar Kwikpen (insulin glargine) from 36 units to 30 units daily in the morning.  - Changed  rapid insulin Fiasp FlexTouch (insulin aspart) from 12 units twice daily prior to meals to 10 units before each meal everyday (3 meals) -Increased dose of GLP-1 Trulicity (dulaglutide) from 0.75 mg to 1.5 mg weekly -Continued SGLT2-I Jardiance (empagliflozin) 25 mg. Counseled on sick day rules -Continued metformin 1000 mg BID - Placed new Dexcom G7 Sensor on patient and provided  instructions and education on how to prevent it from falling off again. Educated on use of "Skin Tack" and Tegaderm -Extensively discussed pathophysiology of diabetes, recommended lifestyle interventions, dietary effects on blood sugar control.  -Counseled on s/sx of and management of hypoglycemia. Encouraged patient to call if she is uncomfortable with low readings or experiences hypoglycemia frequently.  -Next A1c anticipated 07/09/22.   Written patient instructions provided. Patient verbalized understanding of treatment plan.  Total time in face to face counseling 25 minutes.    Follow-up:  PCP clinic visit in 05/13/22.  Patient seen with Estelle June, PharmD Candidate.

## 2022-05-11 NOTE — Patient Instructions (Addendum)
It was great seeing you today!  The following changes were made to your medications:  DECREASE Basaglar Kwikpen (insulin glargine) to 30 units daily in the morning  CHANGE Fiasp FlexTouch (insulin aspart) to 10 units before each meal every day(3 meals).  INCREASE Trulicity (dulaglutide) to 1.5 mg weekly

## 2022-05-12 NOTE — Progress Notes (Signed)
Reviewed and agree with Dr Koval's plan.   

## 2022-05-13 ENCOUNTER — Ambulatory Visit (INDEPENDENT_AMBULATORY_CARE_PROVIDER_SITE_OTHER): Payer: 59 | Admitting: Family Medicine

## 2022-05-13 ENCOUNTER — Other Ambulatory Visit: Payer: Self-pay

## 2022-05-13 VITALS — BP 123/88 | HR 100 | Ht 68.0 in | Wt 310.2 lb

## 2022-05-13 DIAGNOSIS — Z794 Long term (current) use of insulin: Secondary | ICD-10-CM | POA: Diagnosis not present

## 2022-05-13 DIAGNOSIS — E119 Type 2 diabetes mellitus without complications: Secondary | ICD-10-CM

## 2022-05-13 NOTE — Assessment & Plan Note (Signed)
-  recently seen by Dr. Valentina Lucks, continue DM regimen as prescribed -follow up in 2 months for repeat A1c

## 2022-05-13 NOTE — Progress Notes (Signed)
    SUBJECTIVE:   CHIEF COMPLAINT / HPI:   Patient with past medical history including hypertension and type DM presents for follow up. Denies any chest pain or dyspnea. Exercising regularly and eating a healthy, balanced diet. Decreasing carbs significantly and increasing fruit and vegetable intake. Compliant on prescribed medications. Tolerating amlodipine, losartan and HCTZ well. Still not sexually active and we discussed if she is to inform us as the losartan can be harmful during pregnancy.  OBJECTIVE:   BP 123/88   Pulse 100   Ht 5\' 8"  (1.727 m)   Wt (!) 310 lb 3.2 oz (140.7 kg)   LMP 05/02/2022   SpO2 100%   BMI 47.17 kg/m   General: Patient well-appearing, in no acute distress. CV: RRR, no murmurs or gallops auscultated Resp: CTAB, no wheezing, rales or rhonchi noted Psych: mood appropriate, very pleasant   ASSESSMENT/PLAN:   Hypertension -BP 123/88, at goal -continue current antihypertensive regimen -diet and exercise counseling provided   Diabetes mellitus type 2, insulin dependent ([place]) -recently seen by Dr. Valentina Lucks, continue DM regimen as prescribed -follow up in 2 months for repeat A1c     -PHQ-9 score of 3 with negative question 9 reviewed.   Donney Dice, [place]

## 2022-05-13 NOTE — Assessment & Plan Note (Signed)
-  BP 123/88, at goal -continue current antihypertensive regimen -diet and exercise counseling provided

## 2022-05-13 NOTE — Patient Instructions (Addendum)
It was great seeing you today!  Today we discussed your blood pressure and diabetes. Your blood pressure 123/88, which is great! Please continue all your medications as prescribed.   Please follow up at your next scheduled appointment in 2 months, if anything arises between now and then, please don't hesitate to contact our office.   Thank you for allowing [place] to be a part of your medical care!  Thank you, Dr. Larae Grooms  Also a reminder of our clinic's no-show policy. Please make sure to arrive at least 15 minutes prior to your scheduled appointment time. Please try to cancel before 24 hours if you are not able to make it. If you no-show for 2 appointments then you will be receiving a warning letter. If you no-show after 3 visits, then you may be at risk of being dismissed from our clinic. This is to ensure that everyone is able to be seen in a timely manner. Thank you, we appreciate your assistance with this!

## 2022-05-19 ENCOUNTER — Other Ambulatory Visit: Payer: Self-pay

## 2022-05-29 ENCOUNTER — Other Ambulatory Visit (HOSPITAL_COMMUNITY): Payer: Self-pay

## 2022-06-05 ENCOUNTER — Other Ambulatory Visit (HOSPITAL_COMMUNITY): Payer: Self-pay

## 2022-06-17 ENCOUNTER — Other Ambulatory Visit (HOSPITAL_COMMUNITY): Payer: Self-pay

## 2022-06-23 ENCOUNTER — Other Ambulatory Visit (HOSPITAL_COMMUNITY): Payer: Self-pay

## 2022-06-25 ENCOUNTER — Telehealth: Payer: Self-pay | Admitting: *Deleted

## 2022-06-25 NOTE — Telephone Encounter (Signed)
Pt reports that the pharmacy told her that she needs a PA for Trulicity.  To [place].  Jone Baseman, CMA

## 2022-06-26 ENCOUNTER — Other Ambulatory Visit (HOSPITAL_COMMUNITY): Payer: Self-pay

## 2022-06-26 NOTE — Telephone Encounter (Signed)
A Prior Authorization was initiated for this patients TRULICITY through CoverMyMeds.   Key: [id number]

## 2022-06-29 ENCOUNTER — Other Ambulatory Visit (HOSPITAL_COMMUNITY): Payer: Self-pay

## 2022-06-29 NOTE — Telephone Encounter (Signed)
Prior Auth for patients medication TRULICITY denied by [facility] [employer] via CoverMyMeds.   Reason: Your plan only covers this drug when you have tried another drug that your plan covers (preferred drug) and it did not work well for you. We reviewed the information we had. You do not meet these conditions. Your request has been denied.  Reaching out to insurance regarding denial  CoverMyMeds Key: [id number]

## 2022-06-29 NOTE — Telephone Encounter (Signed)
Called insurance - patient must have a trial and failure of Jardiance.

## 2022-06-30 ENCOUNTER — Other Ambulatory Visit (HOSPITAL_COMMUNITY): Payer: Self-pay

## 2022-07-03 ENCOUNTER — Other Ambulatory Visit (HOSPITAL_COMMUNITY): Payer: Self-pay

## 2022-07-09 ENCOUNTER — Other Ambulatory Visit (HOSPITAL_COMMUNITY): Payer: Self-pay

## 2022-07-09 NOTE — Telephone Encounter (Signed)
Patient LVM on nurse line to check status of PA.   Attempted to call patient back. She did not answer, LVM asking her to return call to office.   Veronda Prude, RN

## 2022-07-10 ENCOUNTER — Other Ambulatory Visit (HOSPITAL_COMMUNITY): Payer: Self-pay

## 2022-07-10 ENCOUNTER — Other Ambulatory Visit: Payer: Self-pay

## 2022-07-10 NOTE — Telephone Encounter (Signed)
Patient returns call to nurse line. Advised of denial and that insurance requires trial and failure of Jardiance.   Patient will follow up with PCP on 5/30.  Veronda Prude, RN

## 2022-07-11 ENCOUNTER — Other Ambulatory Visit (HOSPITAL_COMMUNITY): Payer: Self-pay

## 2022-07-14 ENCOUNTER — Other Ambulatory Visit (HOSPITAL_COMMUNITY): Payer: Self-pay

## 2022-07-14 NOTE — Telephone Encounter (Signed)
Prior Auth for patients medication JARDIANCE approved by [facility] [employer] from 07/10/22 to 07/10/23.  CoverMyMeds Key: [facility] PA Case ID #:  [id number]

## 2022-07-16 ENCOUNTER — Other Ambulatory Visit: Payer: Self-pay

## 2022-07-16 ENCOUNTER — Encounter: Payer: Self-pay | Admitting: Family Medicine

## 2022-07-16 ENCOUNTER — Ambulatory Visit (INDEPENDENT_AMBULATORY_CARE_PROVIDER_SITE_OTHER): Payer: 59 | Admitting: Family Medicine

## 2022-07-16 VITALS — BP 139/73 | HR 93 | Ht 68.0 in | Wt 316.4 lb

## 2022-07-16 DIAGNOSIS — E119 Type 2 diabetes mellitus without complications: Secondary | ICD-10-CM | POA: Diagnosis not present

## 2022-07-16 DIAGNOSIS — Z794 Long term (current) use of insulin: Secondary | ICD-10-CM

## 2022-07-16 LAB — POCT GLYCOSYLATED HEMOGLOBIN (HGB A1C): HbA1c, POC (controlled diabetic range): 7.3 % — AB (ref 0.0–7.0)

## 2022-07-16 NOTE — Assessment & Plan Note (Signed)
-  A1c 7.3, congratulated patient on her great progress  -continue current DM regimen, we discussed possibly starting trulicity but given her significant improvement in her A1c I am hesitant to add this now as I do not want her to develop neuropathy and her DM is controlled at this time. We discussed possibly adding this at the next visit which she is agreeable to if appropriate. -diet and exercise counseling provided  -discussed importance of annual routine ophthalmology follow up, encouraged to schedule at her earliest convenience -follow up in 3 months for repeat A1c, consider starting trulicity at this time

## 2022-07-16 NOTE — Patient Instructions (Addendum)
It was great seeing you today!  Today we discussed your blood pressure and diabetes, your blood pressure looks great!   Your A1c is 7.3! Congratulations! Keep eating healthy and staying active.  Continue to take all your medications as prescribed.  Make sure to schedule with the eye doctor at your earliest convenience and at their earliest availability.   Please follow up at your next scheduled appointment in 3 months, if anything arises between now and then, please don't hesitate to contact our office.   Thank you for allowing [place] to be a part of your medical care!  Thank you, Dr. Robyne Peers

## 2022-07-16 NOTE — Assessment & Plan Note (Signed)
-  BP at goal -continue current antihypertensive regimen, patient is not sexually active and aware that if she is she will need to be on birth control and possibly switch off of the ARB as she is aware of the effects on pregnancy

## 2022-07-16 NOTE — Progress Notes (Signed)
    SUBJECTIVE:   CHIEF COMPLAINT / HPI:   Patient presents for DM follow up. History of DM and hypertension. Followed up with Dr. Raymondo Band and had the CGM placed. She is really liking the CGM as she can track her sugars better. Fasting glucose levels have ranged between 130-150s Postprandial ranges have been around 180-210s. Her DM regimen includes jardiance 25 mg daily, glargine 30 units, mealtime 10 units and metformin 1000 mg bid. Was initially prescribed trulicity but has not been able to start this due to prior authorization issues. Denies any neuropathic pain. Has not seen the ophthalmologist in over a year.   History of hypertension. Denies chest pain, dyspnea and edema. Compliant on amlodipine 5 mg daily, HCTZ 12.5 mg daily and losartan 50 mg daily. Has checked her BP at home, seems to be within normal range with systolic 116-125 and diastolic 60-80s.   OBJECTIVE:   BP 139/73   Pulse 93   Ht 5\' 8"  (1.727 m)   Wt (!) 316 lb 6.4 oz (143.5 kg)   LMP 06/26/2022   SpO2 100%   BMI 48.11 kg/m   General: Patient well-appearing, in no acute distress, CV: RRR, no murmurs or gallops auscultated  Resp: CTAB, no wheezing, rales or rhonchi noted Psych: mood appropriate   ASSESSMENT/PLAN:   Diabetes mellitus type 2, insulin dependent ([facility]) -A1c 7.3, congratulated patient on her great progress  -continue current DM regimen, we discussed possibly starting trulicity but given her significant improvement in her A1c I am hesitant to add this now as I do not want her to develop neuropathy and her DM is controlled at this time. We discussed possibly adding this at the next visit which she is agreeable to if appropriate. -diet and exercise counseling provided  -discussed importance of annual routine ophthalmology follow up, encouraged to schedule at her earliest convenience -follow up in 3 months for repeat A1c, consider starting trulicity at this time  Hypertension -BP at goal -continue current  antihypertensive regimen, patient is not sexually active and aware that if she is she will need to be on birth control and possibly switch off of the ARB as she is aware of the effects on pregnancy    Reece Leader, DO [facility]

## 2022-07-27 ENCOUNTER — Other Ambulatory Visit (HOSPITAL_COMMUNITY): Payer: Self-pay

## 2022-07-30 ENCOUNTER — Other Ambulatory Visit (HOSPITAL_COMMUNITY): Payer: Self-pay

## 2022-08-06 ENCOUNTER — Other Ambulatory Visit (HOSPITAL_COMMUNITY): Payer: Self-pay

## 2022-08-06 ENCOUNTER — Other Ambulatory Visit: Payer: Self-pay

## 2022-08-07 ENCOUNTER — Other Ambulatory Visit (HOSPITAL_COMMUNITY): Payer: Self-pay

## 2022-08-07 ENCOUNTER — Other Ambulatory Visit: Payer: Self-pay

## 2022-09-07 ENCOUNTER — Ambulatory Visit (INDEPENDENT_AMBULATORY_CARE_PROVIDER_SITE_OTHER): Payer: 59 | Admitting: Student

## 2022-09-07 ENCOUNTER — Other Ambulatory Visit (HOSPITAL_COMMUNITY): Payer: Self-pay

## 2022-09-07 VITALS — BP 138/90 | HR 92 | Temp 99.0°F | Ht 68.0 in | Wt 307.6 lb

## 2022-09-07 DIAGNOSIS — B349 Viral infection, unspecified: Secondary | ICD-10-CM | POA: Diagnosis not present

## 2022-09-07 NOTE — Patient Instructions (Addendum)
It was great to see you today! Thank you for choosing [facility] for your primary care. [name] was seen for sick visit.  Today we addressed: Please cut back on your long acting insulin to half dosing. You may slowly go back up when you are feeling better. I would expect your symptoms to slowly improve. Please focus on hydration and eating and sleeping. Should your fever persist until the weekend, I would recommend re-evaluation. You may continue tylenol/ibuprofen and Mucinex. If your symptoms persist through the weekend, we should consider antibiotics for you.   If you haven't already, sign up for My Chart to have easy access to your labs results, and communication with your primary care physician.  No follow-ups on file. Please arrive 15 minutes before your appointment to ensure smooth check in process.  We appreciate your efforts in making this happen.  Thank you for allowing me to participate in your care, Shelby Mattocks, DO 09/07/2022, 2:35 PM PGY-3, [facility]

## 2022-09-07 NOTE — Progress Notes (Signed)
  SUBJECTIVE:   CHIEF COMPLAINT / HPI:   Endorses fever, coughing, mucus production, body aches, sinus congestion, headaches. Notes she felt a scratchy throat on Thursday which then progressed to these other symptoms. Tmax 102.4. Last fever was on Saturday.   She was swabbed for COVID this morning but has not gotten the results yet. She has been using mucinex, tylenol, ibuprofen. She has needed her inhaler a couple times.   PERTINENT  PMH / PSH: Asthma, T2DM, HLD, HTN  Patient Care Team: Lorayne Bender, MD as PCP - General OBJECTIVE:  BP (!) 138/90   Pulse 92   Temp 99 F (37.2 C)   Ht 5\' 8"  (1.727 m)   Wt (!) 307 lb 9.6 oz (139.5 kg)   SpO2 100%   BMI 46.77 kg/m  Physical Exam Constitutional:      General: She is not in acute distress.    Appearance: Normal appearance. She is not toxic-appearing.  HENT:     Right Ear: Tympanic membrane normal.     Left Ear: Tympanic membrane normal.     Mouth/Throat:     Mouth: Mucous membranes are moist.     Pharynx: Oropharynx is clear. Posterior oropharyngeal erythema present.  Cardiovascular:     Rate and Rhythm: Normal rate and regular rhythm.     Heart sounds: Normal heart sounds.  Pulmonary:     Breath sounds: Normal breath sounds.  Musculoskeletal:     Cervical back: Normal range of motion. No tenderness.  Lymphadenopathy:     Cervical: No cervical adenopathy.  Neurological:     Mental Status: She is alert.    ASSESSMENT/PLAN:  Viral illness Assessment & Plan: Symptomatology and physical exam concerning for viral illness.  She is awaiting COVID swab from health at work.  No indication for antibiotic treatment at this time.  Written out of work through 7/24.  I have counseled her on decreasing long-acting insulin to half dosage given she is sick and not eating as well and had a hypoglycemic level on her CGM.  She may slowly increase back to her normal insulin dosage as she begins to feel better.  Return next week if symptoms do  not continue to get better for consideration of antibiotics.   Return if symptoms worsen or fail to improve. Shelby Mattocks, DO 09/07/2022, 3:12 PM PGY-3, [facility]

## 2022-09-07 NOTE — Assessment & Plan Note (Signed)
Symptomatology and physical exam concerning for viral illness.  She is awaiting COVID swab from health at work.  No indication for antibiotic treatment at this time.  Written out of work through 7/24.  I have counseled her on decreasing long-acting insulin to half dosage given she is sick and not eating as well and had a hypoglycemic level on her CGM.  She may slowly increase back to her normal insulin dosage as she begins to feel better.  Return next week if symptoms do not continue to get better for consideration of antibiotics.

## 2022-09-08 ENCOUNTER — Other Ambulatory Visit: Payer: Self-pay

## 2022-10-05 ENCOUNTER — Other Ambulatory Visit: Payer: Self-pay | Admitting: Family Medicine

## 2022-10-05 ENCOUNTER — Other Ambulatory Visit (HOSPITAL_COMMUNITY): Payer: Self-pay

## 2022-10-05 DIAGNOSIS — E119 Type 2 diabetes mellitus without complications: Secondary | ICD-10-CM

## 2022-10-05 MED ORDER — METFORMIN HCL ER 500 MG PO TB24
1000.0000 mg | ORAL_TABLET | Freq: Two times a day (BID) | ORAL | 3 refills | Status: DC
  Filled 2022-10-05: qty 120, 30d supply, fill #0
  Filled 2022-11-04: qty 120, 30d supply, fill #1
  Filled 2022-12-02: qty 120, 30d supply, fill #2
  Filled 2023-01-01: qty 120, 30d supply, fill #3

## 2022-10-06 ENCOUNTER — Other Ambulatory Visit (HOSPITAL_COMMUNITY): Payer: Self-pay

## 2022-10-06 ENCOUNTER — Other Ambulatory Visit: Payer: Self-pay

## 2022-10-07 ENCOUNTER — Other Ambulatory Visit: Payer: Self-pay

## 2022-10-08 ENCOUNTER — Other Ambulatory Visit: Payer: Self-pay

## 2022-10-08 ENCOUNTER — Other Ambulatory Visit (HOSPITAL_COMMUNITY): Payer: Self-pay

## 2022-10-09 ENCOUNTER — Other Ambulatory Visit (HOSPITAL_COMMUNITY): Payer: Self-pay

## 2022-10-09 ENCOUNTER — Other Ambulatory Visit: Payer: Self-pay

## 2022-10-12 ENCOUNTER — Other Ambulatory Visit (HOSPITAL_COMMUNITY): Payer: Self-pay

## 2022-10-16 ENCOUNTER — Encounter: Payer: Self-pay | Admitting: Student

## 2022-10-16 ENCOUNTER — Other Ambulatory Visit (HOSPITAL_COMMUNITY): Payer: Self-pay

## 2022-10-16 ENCOUNTER — Ambulatory Visit (INDEPENDENT_AMBULATORY_CARE_PROVIDER_SITE_OTHER): Payer: 59 | Admitting: Student

## 2022-10-16 ENCOUNTER — Other Ambulatory Visit: Payer: Self-pay

## 2022-10-16 VITALS — BP 148/88 | HR 89 | Ht 67.0 in | Wt 310.2 lb

## 2022-10-16 DIAGNOSIS — I10 Essential (primary) hypertension: Secondary | ICD-10-CM

## 2022-10-16 DIAGNOSIS — E119 Type 2 diabetes mellitus without complications: Secondary | ICD-10-CM

## 2022-10-16 DIAGNOSIS — M25512 Pain in left shoulder: Secondary | ICD-10-CM | POA: Diagnosis not present

## 2022-10-16 DIAGNOSIS — M67912 Unspecified disorder of synovium and tendon, left shoulder: Secondary | ICD-10-CM | POA: Insufficient documentation

## 2022-10-16 DIAGNOSIS — Z794 Long term (current) use of insulin: Secondary | ICD-10-CM | POA: Diagnosis not present

## 2022-10-16 LAB — POCT GLYCOSYLATED HEMOGLOBIN (HGB A1C): HbA1c, POC (controlled diabetic range): 6.9 % (ref 0.0–7.0)

## 2022-10-16 MED ORDER — NAPROXEN 500 MG PO TABS
500.0000 mg | ORAL_TABLET | Freq: Two times a day (BID) | ORAL | 0 refills | Status: DC
  Filled 2022-10-16: qty 30, 15d supply, fill #0

## 2022-10-16 NOTE — Patient Instructions (Addendum)
It was great to see you! Thank you for allowing me to participate in your care!  I recommend that you always bring your medications to each appointment as this makes it easy to ensure you are on the correct medications and helps [place] not miss when refills are needed.  Our plans for today:  -Please schedule an appointment with your PCP on your way out -Check your blood pressure daily and bring readings to next appointment with PCP -Please make an appointment with your ophthalmologist for an annual diabetes eye exam -For your shoulder pain, I have prescribed you naproxen.  Take twice daily as needed and take with food.  You can also take Tylenol 1000 mg every 6 hours as needed -I have referred you to sports medicine, they will be able to do an ultrasound of your shoulder if appropriate   We are checking some labs today, I will call you if they are abnormal will send you a MyChart message or a letter if they are normal.  If you do not hear about your labs in the next 2 weeks please let us know.  Take care and seek immediate care sooner if you develop any concerns.   Dr. Erick Alley, DO [facility]

## 2022-10-16 NOTE — Assessment & Plan Note (Signed)
>>  ASSESSMENT AND PLAN FOR ACUTE PAIN OF LEFT SHOULDER WRITTEN ON 10/16/2022  6:19 PM BY Erick Alley, DO  Exam concerning for rotator cuff injury.  Patient agrees with referral to sports medicine for further evaluation.   -Rx naproxen -Tylenol as needed -Referral to sports medicine

## 2022-10-16 NOTE — Assessment & Plan Note (Signed)
Well-controlled on current regimen, no changes today.  Patient plans to follow-up with Dr. Raymondo Band to go over her CGM in near future

## 2022-10-16 NOTE — Assessment & Plan Note (Addendum)
Uncontrolled in office but inconsistent with home readings.  Patient prefers to check BP at home, bring recordings to next appointment with PCP -BMP today -Continue amlodipine HCTZ and losartan -Consider ambulatory blood pressure monitoring with Dr. Raymondo Band

## 2022-10-16 NOTE — Assessment & Plan Note (Signed)
Exam concerning for rotator cuff injury.  Patient agrees with referral to sports medicine for further evaluation.   -Rx naproxen -Tylenol as needed -Referral to sports medicine

## 2022-10-16 NOTE — Progress Notes (Cosign Needed Addendum)
    SUBJECTIVE:   CHIEF COMPLAINT / HPI:   T2DM Current regimen includes glargine 30 units daily, aspart 10 units 3 times daily before meals, metformin 500 mg twice daily with meals, Jardiance 25 mg daily, and Trulicity 1.5 mg weekly A1c today of 6.9  L Shoulder pain Pain started over a month ago, woke up once morning with pain.  No known injury. Worse with lifting above head and reaching back  Works as a Engineer, civil (consulting) in ICU. This pain is making it difficult to do her job.  Has taken tylenol 1000 mg and ibuprofen 600 mg as needed which helps a little.  HTN Current medications:Amlodipine 5 mg daily, hydrochlorothiazide 25 mg daily, losartan 50 mg daily.  Did not take meds today.  Does check BP at home and has been ranging 120s-130s systolic  PERTINENT  PMH / PSH: T2DM, HTN, HLD  OBJECTIVE:   BP (!) 148/88   Pulse 89   Ht 5\' 7"  (1.702 m)   Wt (!) 310 lb 3.2 oz (140.7 kg)   LMP 10/15/2022   SpO2 100%   BMI 48.58 kg/m    General: NAD, pleasant, able to participate in exam Cardiac: RRR, no murmurs. Respiratory: CTAB, normal effort, No wheezes, rales or rhonchi MSK: 5/5 muscle strength of BUEs.   Left shoulder: no edema or deformity, pain with extension, full ROM with flexion without pain, Neer test positive, Hawkins test positive Skin: warm and dry Neuro: alert, no obvious focal deficits Psych: Normal affect and mood  ASSESSMENT/PLAN:   Diabetes mellitus type 2, insulin dependent (HCC) Well-controlled on current regimen, no changes today.  Patient plans to follow-up with Dr. Raymondo Band to go over her CGM in near future  Hypertension Uncontrolled in office but inconsistent with home readings.  Patient prefers to check BP at home, bring recordings to next appointment with PCP -BMP today -Continue amlodipine HCTZ and losartan -Consider ambulatory blood pressure monitoring with Dr. Raymondo Band  Acute pain of left shoulder Exam concerning for rotator cuff injury.  Patient agrees with  referral to sports medicine for further evaluation.   -Rx naproxen -Tylenol as needed -Referral to sports medicine     Dr. Erick Alley, DO [facility]

## 2022-10-17 LAB — BASIC METABOLIC PANEL
BUN/Creatinine Ratio: 16 (ref 9–23)
BUN: 17 mg/dL (ref 6–20)
CO2: 22 mmol/L (ref 20–29)
Calcium: 9.7 mg/dL (ref 8.7–10.2)
Chloride: 101 mmol/L (ref 96–106)
Creatinine, Ser: 1.08 mg/dL — ABNORMAL HIGH (ref 0.57–1.00)
Glucose: 124 mg/dL — ABNORMAL HIGH (ref 70–99)
Potassium: 4.4 mmol/L (ref 3.5–5.2)
Sodium: 139 mmol/L (ref 134–144)
eGFR: 67 mL/min/{1.73_m2} (ref >59)

## 2022-10-20 ENCOUNTER — Encounter: Payer: Self-pay | Admitting: Student

## 2022-10-30 ENCOUNTER — Ambulatory Visit (INDEPENDENT_AMBULATORY_CARE_PROVIDER_SITE_OTHER): Payer: 59 | Admitting: Family Medicine

## 2022-10-30 ENCOUNTER — Encounter: Payer: Self-pay | Admitting: Family Medicine

## 2022-10-30 ENCOUNTER — Other Ambulatory Visit: Payer: Self-pay

## 2022-10-30 ENCOUNTER — Other Ambulatory Visit (HOSPITAL_COMMUNITY): Payer: Self-pay

## 2022-10-30 VITALS — BP 138/82 | Ht 67.5 in | Wt 310.0 lb

## 2022-10-30 DIAGNOSIS — M67912 Unspecified disorder of synovium and tendon, left shoulder: Secondary | ICD-10-CM | POA: Diagnosis not present

## 2022-10-30 DIAGNOSIS — M25512 Pain in left shoulder: Secondary | ICD-10-CM

## 2022-10-30 NOTE — Progress Notes (Signed)
YIYI EDDINGTON - 42 y.o. female MRN [id number]  Date of birth: 11/10/1980  PCP: Lorayne Bender, MD  Subjective:  No chief complaint on file. Left shoulder pain  HPI: Past Medical, Surgical, Social, and Family History Reviewed & Updated per EMR.   Patient is a 42 y.o. female here for severity: Moderate, timing: constant, unchanged, localized, pain located in the anterior left shoulder that started 2 weeks ago without a clear inciting event.  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 It is exacerbated by movement and alleviated by rest, naproxen, and heat.  Ice seems to make the pain worse.  She has not had any distal numbness or weakness.  Past Medical History:  Diagnosis Date   Asthma    Diabetes mellitus    Hypertension     Current Outpatient Medications on File Prior to Visit  Medication Sig Dispense Refill   albuterol (VENTOLIN HFA) 108 (90 Base) MCG/ACT inhaler Inhale 2 puffs into the lungs every 6 (six) hours as needed for wheezing or shortness of breath. 6.7 g 2   amLODipine (NORVASC) 5 MG tablet Take 1 tablet (5 mg total) by mouth daily. 90 tablet 3   atorvastatin (LIPITOR) 20 MG tablet Take 1 tablet (20 mg total) by mouth daily. 90 tablet 3   cetirizine (ZYRTEC) 10 MG tablet Take 1 tablet (10 mg total) by mouth daily. 90 tablet 3   Continuous Glucose Sensor (DEXCOM G7 SENSOR) MISC Apply new sensor every 10 days 3 each 11   Dulaglutide (TRULICITY) 1.5 MG/0.5ML SOPN Inject 1.5 mg into the skin once a week. 2 mL 5   empagliflozin (JARDIANCE) 25 MG TABS tablet Take 1 tablet (25 mg total) by mouth daily. 90 tablet 3   hydrochlorothiazide (HYDRODIURIL) 25 MG tablet Take 1/2 tablet (12.5 mg total) by mouth daily. 45 tablet 3   insulin aspart (FIASP FLEXTOUCH) 100 UNIT/ML FlexTouch Pen Inject 10 Units into the skin 3 (three) times daily before meals. 15 mL 10   Insulin Glargine (BASAGLAR KWIKPEN) 100 UNIT/ML Inject 30 Units into the  skin daily at 6 (six) AM. 15 mL 11   losartan (COZAAR) 50 MG tablet Take 1 tablet (50 mg total) by mouth daily. 90 tablet 3   metFORMIN (GLUCOPHAGE-XR) 500 MG 24 hr tablet Take 2 tablets (1,000 mg total) by mouth 2 (two) times daily with a meal. (Patient taking differently: Take 500 mg by mouth 2 (two) times daily with a meal.) 180 tablet 3   naproxen (NAPROSYN) 500 MG tablet Take 1 tablet (500 mg total) by mouth 2 (two) times daily with a meal. 30 tablet 0   UNIFINE PENTIPS 31G X 8 MM MISC Use to inject insulin into skin daily. 90 each 3   Vitamin D, Cholecalciferol, 50 MCG (2000 UT) CAPS Take 2,000 Units by mouth daily. 90 capsule 3   No current facility-administered medications on file prior to visit.    Past Surgical History:  Procedure Laterality Date   CESAREAN SECTION  2007   d/t gestational HTN   WISDOM TOOTH EXTRACTION      Allergies  Allergen Reactions   Lisinopril Cough        Objective:  Physical Exam: VS: BP:138/82  HR: bpm  TEMP: ( )  RESP:   HT:5' 7.5" (171.5 cm)   WT:(!) 310 lb (140.6 kg)  BMI:47.81  Gen: Well-developed, NAD, speaks clearly, comfortable in exam room Respiratory: normal work of breathing on room  air Skin: No rashes, abrasions, or ecchymosis MSK:  Shoulder: Inspection reveals no abnormalities, atrophy or asymmetry. TTP over [facility] joint  ROM: Flexion full, Extension full, Internal Rotation restricted to just past midline, External Rotation full, Abduction full with pain at 90 to 110 degrees, Adduction not tested, Strength: 5/5 , not tender to palpation over the joint line, and No sensory deficits AC joint: Cross arm test positive, Chuck Norris test did not perform, Subacromial impingement: Hawkins positive, Neers positive, Biceps: Speeds negative, Yergasons negative, and Drop arm negative Full/empty can negative, External/internal rotation lag sign negative, and Lift off negative Sulcus negative and O'Brien's equivocal Normal scapular function  observed. No sensory deficits distally   Ultrasound:  Limited ultrasound of left shoulder:  Biceps Tendon SAX and LAX: visualized in bicipital groove w/ out hypoechoic changes, IR/ER does not demonstrate popping of the tendon out of the groove Subscapularis tendon - viewed in SAX and LAX inserting into the inferior lesser tubercle of humerus.  No hypoechogenicity. fibers intact and no intratendinous changes. AC joint -normal, w/ out osteophyte formation, no giser sign Supraspinatus tendon - viewed in SAX and LAX, tendon in tact, insertion at superior facet of greater tubercle of the humerus w/ echogenics: hypoechoic changes deep to the tendon and are nonspecific, and not involving the tendon fibers. , dynamic view w/ out impingement at the acromion    Summary: Nonspecific hypoechoic changes of the distal supraspinatus, suggestive of tendinopathy without tear  Ultrasound and interpretation by Dr. Webb Silversmith and Dr. Pearletha Forge    Assessment & Plan:   Tendinopathy of left rotator cuff - Jaquetta's history and physical are consistent with tendinopathy of the left rotator cuff. - Limited ultrasound pain suspicious for some laboratory changes of the distal supraspinatus, but her tendons are intact and she has full strength. 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 - We will have her stop naproxen and start meloxicam once daily for a short therapeutic course. - She is getting comfort from heat so we discussed continuing this. - We will start her on formal physical therapy and follow-up in 4 weeks for reevaluation.    Rica Mote MD [facility] Fellow  Addendum:  Patient seen in the office by fellow.  His history, exam, plan of care were precepted with me.  Norton Blizzard MD Marrianne Mood

## 2022-10-30 NOTE — Assessment & Plan Note (Signed)
-   Jaquetta's history and physical are consistent with tendinopathy of the left rotator cuff. - Limited ultrasound pain suspicious for some laboratory changes of the distal supraspinatus, but her tendons are intact and she has full strength. 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 - We will have her stop naproxen and start meloxicam once daily for a short therapeutic course. - She is getting comfort from heat so we discussed continuing this. - We will start her on formal physical therapy and follow-up in 4 weeks for reevaluation.

## 2022-11-06 DIAGNOSIS — M25512 Pain in left shoulder: Secondary | ICD-10-CM | POA: Diagnosis not present

## 2022-11-06 DIAGNOSIS — M25612 Stiffness of left shoulder, not elsewhere classified: Secondary | ICD-10-CM | POA: Diagnosis not present

## 2022-11-06 DIAGNOSIS — M6281 Muscle weakness (generalized): Secondary | ICD-10-CM | POA: Diagnosis not present

## 2022-11-06 DIAGNOSIS — M67912 Unspecified disorder of synovium and tendon, left shoulder: Secondary | ICD-10-CM | POA: Diagnosis not present

## 2022-11-09 DIAGNOSIS — M6281 Muscle weakness (generalized): Secondary | ICD-10-CM | POA: Diagnosis not present

## 2022-11-09 DIAGNOSIS — M67912 Unspecified disorder of synovium and tendon, left shoulder: Secondary | ICD-10-CM | POA: Diagnosis not present

## 2022-11-09 DIAGNOSIS — M25612 Stiffness of left shoulder, not elsewhere classified: Secondary | ICD-10-CM | POA: Diagnosis not present

## 2022-11-09 DIAGNOSIS — M25512 Pain in left shoulder: Secondary | ICD-10-CM | POA: Diagnosis not present

## 2022-11-12 ENCOUNTER — Other Ambulatory Visit: Payer: Self-pay

## 2022-11-12 ENCOUNTER — Other Ambulatory Visit (HOSPITAL_COMMUNITY): Payer: Self-pay

## 2022-11-16 ENCOUNTER — Ambulatory Visit (INDEPENDENT_AMBULATORY_CARE_PROVIDER_SITE_OTHER): Payer: 59 | Admitting: Family Medicine

## 2022-11-16 ENCOUNTER — Other Ambulatory Visit (HOSPITAL_COMMUNITY): Payer: Self-pay

## 2022-11-16 ENCOUNTER — Encounter: Payer: Self-pay | Admitting: Family Medicine

## 2022-11-16 VITALS — BP 122/78 | HR 87 | Ht 67.5 in | Wt 311.0 lb

## 2022-11-16 DIAGNOSIS — I10 Essential (primary) hypertension: Secondary | ICD-10-CM

## 2022-11-16 DIAGNOSIS — E119 Type 2 diabetes mellitus without complications: Secondary | ICD-10-CM

## 2022-11-16 DIAGNOSIS — Z794 Long term (current) use of insulin: Secondary | ICD-10-CM

## 2022-11-16 DIAGNOSIS — M25512 Pain in left shoulder: Secondary | ICD-10-CM | POA: Diagnosis not present

## 2022-11-16 DIAGNOSIS — M67912 Unspecified disorder of synovium and tendon, left shoulder: Secondary | ICD-10-CM

## 2022-11-16 MED ORDER — MELOXICAM 7.5 MG PO TABS
ORAL_TABLET | ORAL | 0 refills | Status: AC
Start: 2022-11-16 — End: 2022-11-30
  Filled 2022-11-16: qty 14, 14d supply, fill #0

## 2022-11-16 NOTE — Progress Notes (Signed)
SUBJECTIVE:   CHIEF COMPLAINT / HPI:   L rotator cuff tendinopathy -saw sports med 2 weeks ago, prescribed meloxicam and started PT -meloxicam was not sent to pharmacy  HTN -medications are amlodipine 5mg  daily, hydrochlorothiazide 25mg  daily, losartan 50mg  daily; has been taking these daily -BP 120s-130s/70-80s  PERTINENT  PMH / PSH: DM2, asthma, HTN, HLD  OBJECTIVE:   BP 122/78   Pulse 87   Ht 5' 7.5" (1.715 m)   Wt (!) 311 lb (141.1 kg)   LMP 11/14/2022   SpO2 97%   BMI 47.99 kg/m   BP 140/82 on initial check  Physical Exam - General: No acute distress. Awake and conversant. - Eyes: Normal conjunctiva, anicteric. Round symmetric pupils. - ENT: Hearing grossly intact. No nasal discharge. - Neck: Neck is supple. No masses or thyromegaly. - Respiratory: Respirations are non-labored. - Skin: No visible rashes or ulcers. - Psych: Alert and oriented. Cooperative, Appropriate mood and affect, Normal judgment. - MSK: Normal ambulation. - Neuro: Sensation and CN II-XII grossly normal.   ASSESSMENT/PLAN:   Hypertension BP: 122/78 today. Well controlled. Goal of <130/80. Continue to work on healthy dietary habits and exercise. Follow up in 1 month.   Medication regimen: amlodipine 5mg  daily, hydrochlorothiazide 25mg  daily, losartan 50mg  daily   Tendinopathy of left rotator cuff Seeing sports medicine, prescribed meloxicam and doing PT. Will f/u at next appt  Diabetes mellitus type 2, insulin dependent (HCC) well controlled - Last A1c:  Lab Results  Component Value Date   HGBA1C 6.9 10/16/2022   - Medications: trulicity 1.5mg  weekly, jardiance 25mg , metformin 500mg  daily, insulin 30u long acting 10u TIDM - Compliance: good, using Dexcom     Patient Instructions  Thank you for coming in today!  Things we discussed today: I ordered meloxicam for you as recommended by the sports medicine doctors. You can take 1 tablet a day for 7 days and then 1 tablet a day  as needed. Please contact the sports medicine office or our office if you have ongoing pain after using this medication. Great job with your blood pressure and diabetes management. See below and attached handout for diet/exercise recommendations.  See you for your physical in November!  Have a great day!    Diet Recommendations for Diabetes  Carbohydrate includes starch, sugar, and fiber.  Of these, only sugar and starch raise blood glucose.  (Fiber is found in fruits, vegetables [especially skin, seeds, and stalks], whole grains, and beans.)   Starchy (carb) foods: Bread, rice, pasta, potatoes, corn, cereal, grits, crackers, bagels, muffins, all baked goods.  (Fruit, milk, and yogurt also have carbohydrate, but most of these foods will not spike your blood sugar as most starchy or sweet foods will.)  A few fruits do cause high blood sugars; use small portions of bananas (limit to 1/2 at a time), grapes, watermelon, and oranges.   Protein foods: Meat, fish, poultry, eggs, dairy foods, and beans such as pinto and kidney beans (beans also provide carbohydrate).   1. Eat at least 3 REAL meals and 1-2 snacks per day.  Eat breakfast within one hour of getting up.  Have something to eat at least every 5 hours while awake.  - A REAL breakfast needs to include both starch and protein foods.   - A REAL meal for lunch or dinner includes at least some protein, some starch, and vegetables and/or fruit.     2. Limit starchy foods to TWO portions per meal and ONE per  snack. ONE portion of a starchy food is equal to the following:  - ONE slice of bread (or its equivalent, such as half of a hamburger bun).  - 1/2 cup of a "scoopable" starchy food such as potatoes or rice.  - 15 grams of Total Carbohydrate as shown on food label.  - 4 ounces of a sweet drink (including fruit juice).  3. Include twice the volume of vegetables as protein or carbohydrate foods in at least 7 meals per week.  - Fresh or frozen  vegetables are best.  - Keep frozen vegetables on hand for a quick option.         Lorayne Bender, MD [facility]

## 2022-11-16 NOTE — Assessment & Plan Note (Signed)
Seeing sports medicine, prescribed meloxicam and doing PT. Will f/u at next appt

## 2022-11-16 NOTE — Patient Instructions (Signed)
Thank you for coming in today!  Things we discussed today: I ordered meloxicam for you as recommended by the sports medicine doctors. You can take 1 tablet a day for 7 days and then 1 tablet a day as needed. Please contact the sports medicine office or our office if you have ongoing pain after using this medication. Great job with your blood pressure and diabetes management. See below and attached handout for diet/exercise recommendations.  See you for your physical in November!  Have a great day!    Diet Recommendations for Diabetes  Carbohydrate includes starch, sugar, and fiber.  Of these, only sugar and starch raise blood glucose.  (Fiber is found in fruits, vegetables [especially skin, seeds, and stalks], whole grains, and beans.)   Starchy (carb) foods: Bread, rice, pasta, potatoes, corn, cereal, grits, crackers, bagels, muffins, all baked goods.  (Fruit, milk, and yogurt also have carbohydrate, but most of these foods will not spike your blood sugar as most starchy or sweet foods will.)  A few fruits do cause high blood sugars; use small portions of bananas (limit to 1/2 at a time), grapes, watermelon, and oranges.   Protein foods: Meat, fish, poultry, eggs, dairy foods, and beans such as pinto and kidney beans (beans also provide carbohydrate).   1. Eat at least 3 REAL meals and 1-2 snacks per day.  Eat breakfast within one hour of getting up.  Have something to eat at least every 5 hours while awake.  - A REAL breakfast needs to include both starch and protein foods.   - A REAL meal for lunch or dinner includes at least some protein, some starch, and vegetables and/or fruit.     2. Limit starchy foods to TWO portions per meal and ONE per snack. ONE portion of a starchy food is equal to the following:  - ONE slice of bread (or its equivalent, such as half of a hamburger bun).  - 1/2 cup of a "scoopable" starchy food such as potatoes or rice.  - 15 grams of Total Carbohydrate as shown  on food label.  - 4 ounces of a sweet drink (including fruit juice).  3. Include twice the volume of vegetables as protein or carbohydrate foods in at least 7 meals per week.  - Fresh or frozen vegetables are best.  - Keep frozen vegetables on hand for a quick option.

## 2022-11-16 NOTE — Assessment & Plan Note (Signed)
BP: 122/78 today. Well controlled. Goal of <130/80. Continue to work on healthy dietary habits and exercise. Follow up in 1 month.   Medication regimen: amlodipine 5mg  daily, hydrochlorothiazide 25mg  daily, losartan 50mg  daily

## 2022-11-16 NOTE — Assessment & Plan Note (Signed)
well controlled - Last A1c:  Lab Results  Component Value Date   HGBA1C 6.9 10/16/2022   - Medications: trulicity 1.5mg  weekly, jardiance 25mg , metformin 500mg  daily, insulin 30u long acting 10u TIDM - Compliance: good, using Dexcom

## 2022-11-27 ENCOUNTER — Other Ambulatory Visit (HOSPITAL_COMMUNITY): Payer: Self-pay

## 2022-11-27 ENCOUNTER — Other Ambulatory Visit: Payer: Self-pay | Admitting: Family Medicine

## 2022-11-27 DIAGNOSIS — M25512 Pain in left shoulder: Secondary | ICD-10-CM

## 2022-11-27 NOTE — Telephone Encounter (Signed)
Pt needs to follow up with sports medicine if having ongoing pain

## 2022-12-02 ENCOUNTER — Other Ambulatory Visit: Payer: Self-pay

## 2022-12-02 ENCOUNTER — Ambulatory Visit: Payer: 59 | Admitting: Family Medicine

## 2022-12-02 ENCOUNTER — Encounter: Payer: Self-pay | Admitting: Family Medicine

## 2022-12-02 ENCOUNTER — Other Ambulatory Visit (HOSPITAL_COMMUNITY): Payer: Self-pay

## 2022-12-02 VITALS — BP 135/78 | Ht 68.0 in | Wt 311.0 lb

## 2022-12-02 DIAGNOSIS — M67912 Unspecified disorder of synovium and tendon, left shoulder: Secondary | ICD-10-CM

## 2022-12-02 MED ORDER — MELOXICAM 15 MG PO TABS
15.0000 mg | ORAL_TABLET | Freq: Every day | ORAL | 0 refills | Status: DC
  Filled 2022-12-02: qty 30, 30d supply, fill #0

## 2022-12-02 NOTE — Assessment & Plan Note (Signed)
-   [name]'s L shoulder pain has improved but still needing some therapy for ROM. 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  - We discussed continuing PT, ROM/strengthening home exercises, adding voltaren gel, and continuing heat/ice - Follow up in 4 weeks unless her pain is completely resolved and she can cancel

## 2022-12-02 NOTE — Progress Notes (Signed)
[name] - 41 y.o. female MRN [id number]  Date of birth: 06-Aug-1980  PCP: Joann Bender, MD  Subjective:  No chief complaint on file.  Left shoulder cuff tendonopathy  HPI: Past Medical, Surgical, Social, and Family History Reviewed & Updated per EMR.   Patient is a 43 y.o. female here for follow up on L shoulder pain, last seen on 10/30/2022. The patient has tried formal PT with heat which has helped with pain and ROM but is still having some stiffness and pain if moving rapidly. Unfortunately she did not receive Mobic after last visit so she was give a short course from her PCP which has helped some. She has not had any numbness or weakness in that arm.    Past Medical History:  Diagnosis Date   Asthma    Diabetes mellitus    Hypertension     Current Outpatient Medications on File Prior to Visit  Medication Sig Dispense Refill   albuterol (VENTOLIN HFA) 108 (90 Base) MCG/ACT inhaler Inhale 2 puffs into the lungs every 6 (six) hours as needed for wheezing or shortness of breath. 6.7 g 2   amLODipine (NORVASC) 5 MG tablet Take 1 tablet (5 mg total) by mouth daily. 90 tablet 3   atorvastatin (LIPITOR) 20 MG tablet Take 1 tablet (20 mg total) by mouth daily. 90 tablet 3   cetirizine (ZYRTEC) 10 MG tablet Take 1 tablet (10 mg total) by mouth daily. 90 tablet 3   Continuous Glucose Sensor (DEXCOM G7 SENSOR) MISC Apply new sensor every 10 days 3 each 11   Dulaglutide (TRULICITY) 1.5 MG/0.5ML SOPN Inject 1.5 mg into the skin once a week. 2 mL 5   empagliflozin (JARDIANCE) 25 MG TABS tablet Take 1 tablet (25 mg total) by mouth daily. 90 tablet 3   hydrochlorothiazide (HYDRODIURIL) 25 MG tablet Take 1/2 tablet (12.5 mg total) by mouth daily. 45 tablet 3   insulin aspart (FIASP FLEXTOUCH) 100 UNIT/ML FlexTouch Pen Inject 10 Units into the skin 3 (three) times daily before meals. 15 mL 10   Insulin Glargine (BASAGLAR KWIKPEN) 100 UNIT/ML Inject 30 Units into the skin daily at 6 (six) AM. 15  mL 11   losartan (COZAAR) 50 MG tablet Take 1 tablet (50 mg total) by mouth daily. 90 tablet 3   metFORMIN (GLUCOPHAGE-XR) 500 MG 24 hr tablet Take 2 tablets (1,000 mg total) by mouth 2 (two) times daily with a meal. (Patient taking differently: Take 500 mg by mouth 2 (two) times daily with a meal.) 180 tablet 3   UNIFINE PENTIPS 31G X 8 MM MISC Use to inject insulin into skin daily. 90 each 3   Vitamin D, Cholecalciferol, 50 MCG (2000 UT) CAPS Take 2,000 Units by mouth daily. 90 capsule 3   No current facility-administered medications on file prior to visit.    Past Surgical History:  Procedure Laterality Date   CESAREAN SECTION  2007   d/t gestational HTN   WISDOM TOOTH EXTRACTION      Allergies  Allergen Reactions   Lisinopril Cough        Objective:  Physical Exam: VS: BP:135/78  HR: bpm  TEMP: ( )  RESP:   HT:5\' 8"  (172.7 cm)   WT:(!) 311 lb (141.1 kg)  BMI:47.3  Gen: NAD, speaks clearly, comfortable in exam room Respiratory: Normal respiratory effort on room air. No signs of distress Skin: No rashes, abrasions, or ecchymosis MSK:  L Shoulder: Inspection no deformity ROM: full in flex, and  abduction IR limited to just past midline ER limited to 60 degrees Strength: 5/5 in all directions except 4/5 in IR/ER AC joint: NT No crepitus palpated w/ movement and no TTP   Assessment & Plan:   Tendinopathy of left rotator cuff - [name]'s L shoulder pain has improved but still needing some therapy for ROM. 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  - We discussed continuing PT, ROM/strengthening home exercises, adding voltaren gel, and continuing heat/ice - Follow up in 4 weeks unless her pain is completely resolved and she can cancel      Rica Mote MD [facility]  Fellow  Addendum:  Patient seen in the office by fellow.  History, exam, plan of care were precepted with me.  Joann Lamer, DO, CAQSM

## 2022-12-16 ENCOUNTER — Other Ambulatory Visit: Payer: Self-pay | Admitting: Family Medicine

## 2022-12-16 ENCOUNTER — Other Ambulatory Visit: Payer: Self-pay

## 2022-12-16 DIAGNOSIS — E119 Type 2 diabetes mellitus without complications: Secondary | ICD-10-CM

## 2022-12-17 MED ORDER — TRULICITY 1.5 MG/0.5ML ~~LOC~~ SOAJ
1.5000 mg | SUBCUTANEOUS | 5 refills | Status: DC
  Filled 2022-12-17: qty 2, 28d supply, fill #0

## 2022-12-18 ENCOUNTER — Other Ambulatory Visit (HOSPITAL_COMMUNITY): Payer: Self-pay

## 2022-12-23 ENCOUNTER — Other Ambulatory Visit (HOSPITAL_COMMUNITY): Payer: Self-pay

## 2023-01-01 ENCOUNTER — Other Ambulatory Visit: Payer: Self-pay

## 2023-01-01 ENCOUNTER — Encounter: Payer: Self-pay | Admitting: Family Medicine

## 2023-01-01 ENCOUNTER — Other Ambulatory Visit (HOSPITAL_COMMUNITY): Payer: Self-pay

## 2023-01-01 ENCOUNTER — Ambulatory Visit (INDEPENDENT_AMBULATORY_CARE_PROVIDER_SITE_OTHER): Payer: 59 | Admitting: Family Medicine

## 2023-01-01 ENCOUNTER — Other Ambulatory Visit: Payer: Self-pay | Admitting: Family Medicine

## 2023-01-01 DIAGNOSIS — E119 Type 2 diabetes mellitus without complications: Secondary | ICD-10-CM | POA: Diagnosis not present

## 2023-01-01 DIAGNOSIS — Z794 Long term (current) use of insulin: Secondary | ICD-10-CM | POA: Diagnosis not present

## 2023-01-01 DIAGNOSIS — I10 Essential (primary) hypertension: Secondary | ICD-10-CM | POA: Diagnosis not present

## 2023-01-01 LAB — POCT GLYCOSYLATED HEMOGLOBIN (HGB A1C): HbA1c, POC (controlled diabetic range): 7.2 % — AB (ref 0.0–7.0)

## 2023-01-01 MED ORDER — HYDROCHLOROTHIAZIDE 25 MG PO TABS
12.5000 mg | ORAL_TABLET | Freq: Every day | ORAL | 3 refills | Status: DC
  Filled 2023-01-01: qty 15, 30d supply, fill #0
  Filled 2023-01-29: qty 15, 30d supply, fill #1
  Filled 2023-02-25: qty 15, 30d supply, fill #2
  Filled 2023-03-22 – 2023-04-06 (×2): qty 15, 30d supply, fill #3
  Filled 2023-05-02: qty 15, 30d supply, fill #4
  Filled 2023-06-05: qty 15, 30d supply, fill #5
  Filled 2023-07-04 – 2023-07-20 (×2): qty 15, 30d supply, fill #6
  Filled 2023-08-12: qty 15, 30d supply, fill #7
  Filled 2023-09-10: qty 15, 30d supply, fill #8
  Filled 2023-10-14: qty 15, 30d supply, fill #9
  Filled 2023-11-19: qty 15, 30d supply, fill #10
  Filled 2023-12-18: qty 15, 30d supply, fill #11

## 2023-01-01 MED ORDER — EMPAGLIFLOZIN 25 MG PO TABS
25.0000 mg | ORAL_TABLET | Freq: Every day | ORAL | 3 refills | Status: DC
  Filled 2023-01-01: qty 30, 30d supply, fill #0
  Filled 2023-01-29: qty 30, 30d supply, fill #1
  Filled 2023-02-25: qty 30, 30d supply, fill #2
  Filled 2023-03-22 – 2023-04-06 (×2): qty 30, 30d supply, fill #3
  Filled 2023-05-02: qty 30, 30d supply, fill #4
  Filled 2023-06-05: qty 30, 30d supply, fill #5
  Filled 2023-07-04 – 2023-07-20 (×2): qty 30, 30d supply, fill #6
  Filled 2023-08-12: qty 30, 30d supply, fill #7
  Filled 2023-09-10: qty 30, 30d supply, fill #8
  Filled 2023-10-14: qty 30, 30d supply, fill #9
  Filled 2023-11-19: qty 30, 30d supply, fill #10
  Filled 2023-12-18: qty 30, 30d supply, fill #11

## 2023-01-01 MED ORDER — TRULICITY 3 MG/0.5ML ~~LOC~~ SOAJ
3.0000 mg | SUBCUTANEOUS | 0 refills | Status: DC
  Filled 2023-01-01: qty 2, 28d supply, fill #0

## 2023-01-01 MED ORDER — AMLODIPINE BESYLATE 5 MG PO TABS
5.0000 mg | ORAL_TABLET | Freq: Every day | ORAL | 3 refills | Status: DC
  Filled 2023-01-01: qty 30, 30d supply, fill #0
  Filled 2023-01-29: qty 30, 30d supply, fill #1
  Filled 2023-02-25: qty 30, 30d supply, fill #2
  Filled 2023-03-22 – 2023-04-06 (×2): qty 30, 30d supply, fill #3
  Filled 2023-05-02: qty 30, 30d supply, fill #4
  Filled 2023-06-05: qty 30, 30d supply, fill #5
  Filled 2023-07-04 – 2023-07-20 (×2): qty 30, 30d supply, fill #6
  Filled 2023-08-12: qty 30, 30d supply, fill #7
  Filled 2023-09-10: qty 30, 30d supply, fill #8
  Filled 2023-10-14: qty 30, 30d supply, fill #9
  Filled 2023-11-19: qty 30, 30d supply, fill #10
  Filled 2023-12-18: qty 30, 30d supply, fill #11

## 2023-01-01 MED ORDER — FIASP FLEXTOUCH 100 UNIT/ML ~~LOC~~ SOPN
7.0000 [IU] | PEN_INJECTOR | Freq: Three times a day (TID) | SUBCUTANEOUS | Status: DC
Start: 2023-01-01 — End: 2023-01-21

## 2023-01-01 MED ORDER — METFORMIN HCL ER 500 MG PO TB24
500.0000 mg | ORAL_TABLET | Freq: Two times a day (BID) | ORAL | 3 refills | Status: DC
Start: 2023-01-01 — End: 2023-01-21

## 2023-01-01 MED ORDER — LOSARTAN POTASSIUM 50 MG PO TABS
50.0000 mg | ORAL_TABLET | Freq: Every day | ORAL | 3 refills | Status: DC
  Filled 2023-01-01: qty 30, 30d supply, fill #0
  Filled 2023-01-29: qty 30, 30d supply, fill #1
  Filled 2023-02-25: qty 30, 30d supply, fill #2
  Filled 2023-03-22 – 2023-04-06 (×2): qty 30, 30d supply, fill #3
  Filled 2023-05-02: qty 30, 30d supply, fill #4
  Filled 2023-06-05: qty 30, 30d supply, fill #5
  Filled 2023-07-04 – 2023-07-20 (×2): qty 30, 30d supply, fill #6
  Filled 2023-08-12: qty 30, 30d supply, fill #7
  Filled 2023-09-10: qty 30, 30d supply, fill #8
  Filled 2023-10-14: qty 30, 30d supply, fill #9
  Filled 2023-11-19: qty 30, 30d supply, fill #10
  Filled 2023-12-18: qty 30, 30d supply, fill #11

## 2023-01-01 NOTE — Assessment & Plan Note (Signed)
well controlled - Last A1c:  Lab Results  Component Value Date   HGBA1C 7.2 (A) 01/01/2023   - Medications: metformin 500mg  BID, jardiance 25mg  daily, glargine 30 daily; increasing Trulicity to 3mL weekly as pt has not had significant weight loss on 1.27mL, decreasing mealtime insulin to 7 units. Follow up in 2 weeks to assess tolerance of higher Trulicity dose and adjust mealtime insulin regimen as needed. - Checking microalbumin/creatinine ratio today

## 2023-01-01 NOTE — Progress Notes (Signed)
    SUBJECTIVE:   Chief compliant/HPI: annual examination  [name] is a 42 y.o. who presents today for an annual exam.   Review of systems form notable for: L shoulder pain - improving with physical therapy and as needed mobic.   Updated history tabs and problem list .   Reviewed medication list  OBJECTIVE:   BP (!) 141/76   Pulse (!) 103   Ht 5\' 8"  (1.727 m)   Wt (!) 314 lb 6.4 oz (142.6 kg)   SpO2 100%   BMI 47.80 kg/m    Physical exam Gen: sitting in exam room, pleasant and conversant in NAD CV: RRR, normal S1/S2, no murmurs Pulm: CTAB, normal WOB on RA Abd: normoactive bowel sounds, soft, nontender, nondistended Psych: appropriate mood and affect  ASSESSMENT/PLAN:   Diabetes mellitus type 2, insulin dependent (HCC) well controlled - Last A1c:  Lab Results  Component Value Date   HGBA1C 7.2 (A) 01/01/2023   - Medications: metformin 500mg  BID, jardiance 25mg  daily, glargine 30 daily; increasing Trulicity to 3mL weekly as pt has not had significant weight loss on 1.11mL, decreasing mealtime insulin to 7 units. Follow up in 2 weeks to assess tolerance of higher Trulicity dose and adjust mealtime insulin regimen as needed. - Checking microalbumin/creatinine ratio today    Annual Examination  See AVS for age appropriate recommendations.   PHQ score 5, reviewed and discussed. Reports low mood at times. Pt interested in counseling, advised finding in-network provider through insurance company Blood pressure reviewed - above goal, has been taking blood pressure medications daily. Will recheck at next appointment  Asked about intimate partner violence and patient reports no concerns.  The patient currently uses abstinence for contraception. Folate recommended as appropriate, minimum of 400 mcg per day. Encouraged patient to initiate contraception, especially since she takes several medications that are not safe in pregnancy. Pt states she is not currently sexually  active but will look into contraception if she becomes sexually active again. Advised the use of condoms. Advanced directives not completed   Considered the following items based upon USPSTF recommendations: HIV testing:  not ordered, no new sexual partners or drug use   Hepatitis C:  low risk, negative  2019 Hepatitis B:  low risk, HBV vaccine given in 2020  Syphilis if at high risk:  no new sexual partners, negative in 2020 GC/CT not at high risk and not ordered. Lipid panel (nonfasting or fasting) discussed based upon AHA recommendations and not ordered. Good compliance with statin. Consider repeat every 4-6 years.  Reviewed risk factors for latent tuberculosis and not indicated  Discussed family history, BRCA testing not indicated. Tool used to risk stratify was Pedigree Assessment tool no family history of breast cancer Cervical cancer screening: prior Pap reviewed, repeat due in 2027 Immunizations none today   Follow up in 2 weeks to review diabetes medications.    Lorayne Bender, MD [facility]

## 2023-01-01 NOTE — Patient Instructions (Addendum)
Thank you for coming in today!  Things we discussed today: Increase your trulicity dose to 3mL weekly. 2.  Change meal time insulin to 7 units starting after you begin the higher dose of Trulicity. Please send me a message or call the office if your blood sugar readings are much higher than normal and we can adjust the insulin as needed.    Have a great day!

## 2023-01-03 LAB — MICROALBUMIN / CREATININE URINE RATIO
Creatinine, Urine: 84.2 mg/dL
Microalb/Creat Ratio: 46 mg/g{creat} — ABNORMAL HIGH (ref 0–29)
Microalbumin, Urine: 39 ug/mL

## 2023-01-04 ENCOUNTER — Other Ambulatory Visit: Payer: Self-pay

## 2023-01-06 ENCOUNTER — Ambulatory Visit: Payer: 59 | Admitting: Family Medicine

## 2023-01-08 ENCOUNTER — Other Ambulatory Visit (HOSPITAL_COMMUNITY): Payer: Self-pay

## 2023-01-18 ENCOUNTER — Other Ambulatory Visit (HOSPITAL_COMMUNITY): Payer: Self-pay

## 2023-01-18 ENCOUNTER — Ambulatory Visit: Payer: Self-pay | Admitting: Family Medicine

## 2023-01-18 NOTE — Progress Notes (Unsigned)
    SUBJECTIVE:   CHIEF COMPLAINT / HPI:   T2DM -Current medication regimen: Metformin 500 mg twice daily, Jardiance 25 mg daily, insulin, glargine 30 units daily, mealtime insulin 7 units, Trulicity 3 mg weekly (increased from 1.5 11/15) -Home CBGs:*** -Denies polyuria, polydipsia, abdominal pain, chest pain, shortness of breath***  Lab Results  Component Value Date   HGBA1C 7.2 (A) 01/01/2023   HGBA1C 6.9 10/16/2022   HGBA1C 7.3 (A) 07/16/2022   ***  PERTINENT  PMH / PSH: ***  OBJECTIVE:   There were no vitals taken for this visit.  ***  ASSESSMENT/PLAN:   Assessment & Plan    Vonna Drafts, MD [facility]

## 2023-01-21 ENCOUNTER — Other Ambulatory Visit: Payer: Self-pay | Admitting: Family Medicine

## 2023-01-21 ENCOUNTER — Encounter: Payer: Self-pay | Admitting: Family Medicine

## 2023-01-21 ENCOUNTER — Other Ambulatory Visit (HOSPITAL_COMMUNITY): Payer: Self-pay

## 2023-01-21 DIAGNOSIS — E119 Type 2 diabetes mellitus without complications: Secondary | ICD-10-CM

## 2023-01-21 MED ORDER — METFORMIN HCL ER 500 MG PO TB24
500.0000 mg | ORAL_TABLET | Freq: Two times a day (BID) | ORAL | 11 refills | Status: DC
  Filled 2023-01-21: qty 60, 30d supply, fill #0
  Filled 2023-02-25: qty 60, 30d supply, fill #1
  Filled 2023-03-22 – 2023-04-06 (×2): qty 60, 30d supply, fill #2
  Filled 2023-06-05: qty 60, 30d supply, fill #3
  Filled 2023-07-04 – 2023-07-20 (×2): qty 60, 30d supply, fill #4
  Filled 2023-08-12: qty 60, 30d supply, fill #5
  Filled 2023-09-10: qty 60, 30d supply, fill #6
  Filled 2023-10-14: qty 60, 30d supply, fill #7
  Filled 2023-12-18: qty 60, 30d supply, fill #8

## 2023-01-23 ENCOUNTER — Other Ambulatory Visit (HOSPITAL_COMMUNITY): Payer: Self-pay

## 2023-01-23 MED ORDER — FIASP FLEXTOUCH 100 UNIT/ML ~~LOC~~ SOPN
7.0000 [IU] | PEN_INJECTOR | Freq: Three times a day (TID) | SUBCUTANEOUS | 11 refills | Status: DC
  Filled 2023-01-23: qty 6, 28d supply, fill #0
  Filled 2023-02-25 – 2023-03-03 (×3): qty 6, 28d supply, fill #1

## 2023-01-29 ENCOUNTER — Other Ambulatory Visit: Payer: Self-pay

## 2023-01-29 ENCOUNTER — Other Ambulatory Visit: Payer: Self-pay | Admitting: Family Medicine

## 2023-01-29 DIAGNOSIS — E119 Type 2 diabetes mellitus without complications: Secondary | ICD-10-CM

## 2023-02-05 ENCOUNTER — Other Ambulatory Visit (HOSPITAL_COMMUNITY): Payer: Self-pay

## 2023-02-05 MED ORDER — TRULICITY 3 MG/0.5ML ~~LOC~~ SOAJ
3.0000 mg | SUBCUTANEOUS | 0 refills | Status: DC
  Filled 2023-02-05 – 2023-02-25 (×2): qty 2, 28d supply, fill #0

## 2023-02-12 ENCOUNTER — Other Ambulatory Visit (HOSPITAL_COMMUNITY): Payer: Self-pay

## 2023-02-15 ENCOUNTER — Other Ambulatory Visit: Payer: Self-pay | Admitting: Family Medicine

## 2023-02-19 ENCOUNTER — Other Ambulatory Visit (HOSPITAL_COMMUNITY): Payer: Self-pay

## 2023-02-24 ENCOUNTER — Other Ambulatory Visit (HOSPITAL_COMMUNITY): Payer: Self-pay

## 2023-02-25 ENCOUNTER — Other Ambulatory Visit: Payer: Self-pay | Admitting: Family Medicine

## 2023-02-25 ENCOUNTER — Other Ambulatory Visit (HOSPITAL_COMMUNITY): Payer: Self-pay

## 2023-02-26 ENCOUNTER — Telehealth: Payer: Self-pay

## 2023-02-26 NOTE — Telephone Encounter (Signed)
 Pharmacy Patient Advocate Encounter   Received notification from CoverMyMeds that prior authorization for [facility] G7 SENSOR is required/requested.   Insurance verification completed.   The patient is insured through [facility] .    PA required; PA submitted to above mentioned insurance via CoverMyMeds Key/confirmation #/[id number]. Status is pending

## 2023-03-01 ENCOUNTER — Other Ambulatory Visit (HOSPITAL_COMMUNITY): Payer: Self-pay

## 2023-03-01 ENCOUNTER — Telehealth: Payer: Self-pay

## 2023-03-01 NOTE — Telephone Encounter (Signed)
 Pharmacy Patient Advocate Encounter   Received notification from CoverMyMeds that prior authorization for Fiasp  Flextouch is required/requested.   Insurance verification completed.   The patient is insured through [employer] .   PA required; PA submitted to above mentioned insurance via CoverMyMeds Key/confirmation #/EOC [facility]. Status is pending

## 2023-03-03 ENCOUNTER — Other Ambulatory Visit (HOSPITAL_COMMUNITY): Payer: Self-pay

## 2023-03-04 ENCOUNTER — Other Ambulatory Visit (HOSPITAL_COMMUNITY): Payer: Self-pay

## 2023-03-04 NOTE — Telephone Encounter (Signed)
Pharmacy Patient Advocate Encounter  Received notification from [facility] that Prior Authorization for FIASP has been DENIED.  Full denial letter will be uploaded to the media tab. See denial reason below.  To get the request approved, your doctor needs to show that you have met the guideline rules below. If you have questions, please call your doctor. In some cases, the requested drug or alternatives offered may have other guideline rules that need to be met.  Your provider requested Fiasp FlexTouch 100unit/mL pens to manage your condition of type 2 diabetes mellitus (a chronic condition in which your body is resistant to insulin, a hormone that regulates your blood sugar).  When used for the treatment of type 2 diabetes mellitus, our guideline named STANDARD STEP THERAPY, reviewed for Fiasp FlexTouch 100unit/mL pens, requires that you meet ONE (1) of the following rules for approval: 1) You have previously tried Lyumjev, OR 2) You have a valid medical reason (a contraindication) why you cannot try Lyumjev.

## 2023-03-04 NOTE — Telephone Encounter (Signed)
Pharmacy Patient Advocate Encounter  Received notification from [facility] that Prior Authorization for [facility] G7 SENSOR has been APPROVED from 03/02/23 to 02/29/24   PA #/Case ID/Reference #: [id number]

## 2023-03-05 ENCOUNTER — Other Ambulatory Visit (HOSPITAL_COMMUNITY): Payer: Self-pay

## 2023-03-05 ENCOUNTER — Other Ambulatory Visit: Payer: Self-pay | Admitting: Family Medicine

## 2023-03-05 DIAGNOSIS — E119 Type 2 diabetes mellitus without complications: Secondary | ICD-10-CM

## 2023-03-05 MED ORDER — LYUMJEV KWIKPEN 100 UNIT/ML ~~LOC~~ SOPN
7.0000 [IU] | PEN_INJECTOR | Freq: Three times a day (TID) | SUBCUTANEOUS | 11 refills | Status: DC
  Filled 2023-03-05: qty 6, 29d supply, fill #0
  Filled 2023-03-22: qty 6, 28d supply, fill #0

## 2023-03-08 ENCOUNTER — Other Ambulatory Visit (HOSPITAL_COMMUNITY): Payer: Self-pay

## 2023-03-15 ENCOUNTER — Other Ambulatory Visit (HOSPITAL_COMMUNITY): Payer: Self-pay

## 2023-03-17 ENCOUNTER — Other Ambulatory Visit (HOSPITAL_COMMUNITY): Payer: Self-pay

## 2023-03-22 ENCOUNTER — Other Ambulatory Visit: Payer: Self-pay | Admitting: Family Medicine

## 2023-03-22 ENCOUNTER — Other Ambulatory Visit: Payer: Self-pay

## 2023-03-22 ENCOUNTER — Other Ambulatory Visit (HOSPITAL_COMMUNITY): Payer: Self-pay

## 2023-03-22 DIAGNOSIS — E119 Type 2 diabetes mellitus without complications: Secondary | ICD-10-CM

## 2023-03-22 MED ORDER — LYUMJEV KWIKPEN 100 UNIT/ML ~~LOC~~ SOPN
7.0000 [IU] | PEN_INJECTOR | Freq: Three times a day (TID) | SUBCUTANEOUS | 11 refills | Status: DC
  Filled 2023-03-22 – 2023-05-06 (×3): qty 6, 29d supply, fill #0
  Filled 2023-06-05: qty 6, 29d supply, fill #1
  Filled 2023-07-04 – 2023-07-20 (×2): qty 6, 29d supply, fill #2
  Filled 2023-08-12: qty 6, 29d supply, fill #3
  Filled 2023-09-10: qty 6, 29d supply, fill #4
  Filled 2023-10-11 – 2023-10-22 (×2): qty 6, 29d supply, fill #5
  Filled 2023-11-19: qty 6, 29d supply, fill #6
  Filled 2023-12-18: qty 6, 29d supply, fill #7
  Filled 2024-01-23: qty 6, 29d supply, fill #8

## 2023-03-23 ENCOUNTER — Other Ambulatory Visit (HOSPITAL_COMMUNITY): Payer: Self-pay

## 2023-03-23 ENCOUNTER — Encounter (HOSPITAL_COMMUNITY): Payer: Self-pay

## 2023-03-25 ENCOUNTER — Other Ambulatory Visit (HOSPITAL_COMMUNITY): Payer: Self-pay

## 2023-03-25 ENCOUNTER — Telehealth: Payer: Self-pay

## 2023-03-25 NOTE — Telephone Encounter (Signed)
 Pharmacy Patient Advocate Encounter   Received notification from CoverMyMeds that prior authorization for BASAGLAR  is required/requested.   Insurance verification completed.   The patient is insured through [facility] .   PA required; PA submitted to above mentioned insurance via CoverMyMeds Key/confirmation #/EOC [id number]. Status is pending

## 2023-03-29 NOTE — Telephone Encounter (Signed)
 Faxed additional PA questions

## 2023-03-30 ENCOUNTER — Other Ambulatory Visit (HOSPITAL_COMMUNITY): Payer: Self-pay

## 2023-04-01 ENCOUNTER — Other Ambulatory Visit (HOSPITAL_COMMUNITY): Payer: Self-pay

## 2023-04-02 ENCOUNTER — Other Ambulatory Visit (HOSPITAL_COMMUNITY): Payer: Self-pay

## 2023-04-05 NOTE — Telephone Encounter (Signed)
Attempted to reach patient. No answer. LVM for patient to call office to make appt with any provider to discuss medications.Aquilla Solian, CMA

## 2023-04-05 NOTE — Telephone Encounter (Signed)
Pharmacy Patient Advocate Encounter  Received notification from [facility] that Prior Authorization for [facility] has been DENIED.  Full denial letter will be uploaded to the media tab. See denial reason below.  Our guideline named STANDARD STEP THERAPY requires that you meet ONE of the following rules for approval:  1. You had a previous trial of preferred step therapy agents, OR  2. Your doctor has provided medical justification (reasons) why you are not able to try the step therapy agents.  Approval requires you to try: Evaristo Bury FlexTouch, Semglee-YFGN Pen or [employer]. This is why your request is denied.    PA #/Case ID/Reference #: [id number]

## 2023-04-06 ENCOUNTER — Other Ambulatory Visit: Payer: Self-pay | Admitting: Family Medicine

## 2023-04-06 ENCOUNTER — Other Ambulatory Visit (HOSPITAL_COMMUNITY): Payer: Self-pay

## 2023-04-06 DIAGNOSIS — E119 Type 2 diabetes mellitus without complications: Secondary | ICD-10-CM

## 2023-04-06 MED ORDER — TRULICITY 3 MG/0.5ML ~~LOC~~ SOAJ
3.0000 mg | SUBCUTANEOUS | 0 refills | Status: DC
  Filled 2023-04-06: qty 2, 28d supply, fill #0

## 2023-04-22 ENCOUNTER — Other Ambulatory Visit (HOSPITAL_COMMUNITY): Payer: Self-pay

## 2023-04-23 ENCOUNTER — Telehealth: Payer: Self-pay | Admitting: Family Medicine

## 2023-04-23 ENCOUNTER — Encounter: Payer: Self-pay | Admitting: Family Medicine

## 2023-04-23 ENCOUNTER — Other Ambulatory Visit: Payer: Self-pay

## 2023-04-23 ENCOUNTER — Other Ambulatory Visit (HOSPITAL_COMMUNITY): Payer: Self-pay

## 2023-04-23 ENCOUNTER — Other Ambulatory Visit: Payer: Self-pay | Admitting: Family Medicine

## 2023-04-23 DIAGNOSIS — Z794 Long term (current) use of insulin: Secondary | ICD-10-CM

## 2023-04-23 MED ORDER — INSUPEN PEN NEEDLES 31G X 8 MM MISC
Freq: Every day | 0 refills | Status: AC
  Filled 2023-04-23: qty 100, 30d supply, fill #0
  Filled 2023-09-10: qty 100, 90d supply, fill #0

## 2023-04-23 MED ORDER — INSULIN GLARGINE 100 UNIT/ML SOLOSTAR PEN
30.0000 [IU] | PEN_INJECTOR | Freq: Every day | SUBCUTANEOUS | 0 refills | Status: DC
  Filled 2023-04-23 – 2023-05-06 (×2): qty 15, 50d supply, fill #0

## 2023-04-23 NOTE — Telephone Encounter (Signed)
-----   Message from [place] sent at 04/23/2023  9:29 AM EST ----- Regarding: Insulin switch from Basaglar to Lantus Hey Doc this is [name] at [facility] messaging about an insulin switch from Basaglar to Lantus. Insurance want an alternative. Thanks!

## 2023-04-23 NOTE — Telephone Encounter (Signed)
 Pharmacy reached out regarding insulin switch request. Will message patient via MyChart. Insulin switched to Lantus per pharmacy.   I called pharmacy - Basaglar will cost $400+ while Lantus will cost $70.

## 2023-04-26 ENCOUNTER — Encounter (HOSPITAL_COMMUNITY): Payer: Self-pay

## 2023-04-26 ENCOUNTER — Other Ambulatory Visit (HOSPITAL_COMMUNITY): Payer: Self-pay

## 2023-05-02 ENCOUNTER — Other Ambulatory Visit: Payer: Self-pay | Admitting: Family Medicine

## 2023-05-02 ENCOUNTER — Other Ambulatory Visit: Payer: Self-pay

## 2023-05-02 DIAGNOSIS — E119 Type 2 diabetes mellitus without complications: Secondary | ICD-10-CM

## 2023-05-03 ENCOUNTER — Other Ambulatory Visit: Payer: Self-pay

## 2023-05-03 ENCOUNTER — Other Ambulatory Visit (HOSPITAL_COMMUNITY): Payer: Self-pay

## 2023-05-03 MED ORDER — DEXCOM G7 SENSOR MISC
1 refills | Status: DC
  Filled 2023-05-03: qty 3, 30d supply, fill #0
  Filled 2023-06-05: qty 3, 30d supply, fill #1

## 2023-05-05 ENCOUNTER — Other Ambulatory Visit (HOSPITAL_COMMUNITY): Payer: Self-pay

## 2023-05-05 ENCOUNTER — Encounter (INDEPENDENT_AMBULATORY_CARE_PROVIDER_SITE_OTHER): Payer: Self-pay

## 2023-05-06 ENCOUNTER — Other Ambulatory Visit (HOSPITAL_COMMUNITY): Payer: Self-pay

## 2023-05-11 ENCOUNTER — Other Ambulatory Visit (HOSPITAL_COMMUNITY): Payer: Self-pay

## 2023-05-12 ENCOUNTER — Other Ambulatory Visit (HOSPITAL_COMMUNITY): Payer: Self-pay

## 2023-06-05 ENCOUNTER — Other Ambulatory Visit: Payer: Self-pay | Admitting: Family Medicine

## 2023-06-05 ENCOUNTER — Other Ambulatory Visit: Payer: Self-pay

## 2023-06-05 DIAGNOSIS — E119 Type 2 diabetes mellitus without complications: Secondary | ICD-10-CM

## 2023-06-07 ENCOUNTER — Other Ambulatory Visit: Payer: Self-pay

## 2023-06-07 ENCOUNTER — Other Ambulatory Visit (HOSPITAL_COMMUNITY): Payer: Self-pay

## 2023-06-07 MED ORDER — TRULICITY 3 MG/0.5ML ~~LOC~~ SOAJ
3.0000 mg | SUBCUTANEOUS | 0 refills | Status: DC
  Filled 2023-06-07: qty 2, 28d supply, fill #0

## 2023-06-07 MED ORDER — SEMGLEE (YFGN) 100 UNIT/ML ~~LOC~~ SOPN
30.0000 [IU] | PEN_INJECTOR | Freq: Every day | SUBCUTANEOUS | 0 refills | Status: DC
  Filled 2023-06-07 – 2023-07-20 (×4): qty 15, 50d supply, fill #0

## 2023-06-08 ENCOUNTER — Other Ambulatory Visit: Payer: Self-pay

## 2023-06-08 ENCOUNTER — Other Ambulatory Visit (HOSPITAL_COMMUNITY): Payer: Self-pay

## 2023-06-10 ENCOUNTER — Other Ambulatory Visit: Payer: Self-pay

## 2023-06-18 ENCOUNTER — Other Ambulatory Visit (HOSPITAL_COMMUNITY): Payer: Self-pay

## 2023-06-21 ENCOUNTER — Other Ambulatory Visit: Payer: Self-pay

## 2023-06-23 ENCOUNTER — Other Ambulatory Visit (HOSPITAL_COMMUNITY): Payer: Self-pay

## 2023-07-04 ENCOUNTER — Other Ambulatory Visit: Payer: Self-pay | Admitting: Family Medicine

## 2023-07-04 ENCOUNTER — Other Ambulatory Visit: Payer: Self-pay

## 2023-07-04 DIAGNOSIS — E119 Type 2 diabetes mellitus without complications: Secondary | ICD-10-CM

## 2023-07-05 ENCOUNTER — Other Ambulatory Visit: Payer: Self-pay | Admitting: Family Medicine

## 2023-07-05 ENCOUNTER — Other Ambulatory Visit (HOSPITAL_COMMUNITY): Payer: Self-pay

## 2023-07-05 ENCOUNTER — Other Ambulatory Visit: Payer: Self-pay

## 2023-07-05 DIAGNOSIS — E782 Mixed hyperlipidemia: Secondary | ICD-10-CM

## 2023-07-06 ENCOUNTER — Other Ambulatory Visit (HOSPITAL_COMMUNITY): Payer: Self-pay

## 2023-07-06 MED ORDER — ATORVASTATIN CALCIUM 20 MG PO TABS
20.0000 mg | ORAL_TABLET | Freq: Every day | ORAL | 3 refills | Status: AC
  Filled 2023-07-06 – 2023-07-20 (×2): qty 90, 90d supply, fill #0
  Filled 2023-08-12 – 2023-10-14 (×2): qty 90, 90d supply, fill #1
  Filled 2024-01-23: qty 90, 90d supply, fill #2

## 2023-07-06 MED ORDER — DEXCOM G7 SENSOR MISC
0 refills | Status: DC
  Filled 2023-07-06 – 2023-07-20 (×2): qty 3, 30d supply, fill #0

## 2023-07-13 ENCOUNTER — Other Ambulatory Visit (HOSPITAL_COMMUNITY): Payer: Self-pay

## 2023-07-15 ENCOUNTER — Other Ambulatory Visit (HOSPITAL_COMMUNITY): Payer: Self-pay

## 2023-07-20 ENCOUNTER — Other Ambulatory Visit (HOSPITAL_COMMUNITY): Payer: Self-pay

## 2023-07-27 ENCOUNTER — Other Ambulatory Visit: Payer: Self-pay

## 2023-08-12 ENCOUNTER — Other Ambulatory Visit: Payer: Self-pay | Admitting: Family Medicine

## 2023-08-12 DIAGNOSIS — E119 Type 2 diabetes mellitus without complications: Secondary | ICD-10-CM

## 2023-08-13 ENCOUNTER — Other Ambulatory Visit: Payer: Self-pay

## 2023-08-13 ENCOUNTER — Other Ambulatory Visit (HOSPITAL_COMMUNITY): Payer: Self-pay

## 2023-08-17 ENCOUNTER — Other Ambulatory Visit (HOSPITAL_COMMUNITY): Payer: Self-pay

## 2023-08-17 ENCOUNTER — Other Ambulatory Visit: Payer: Self-pay

## 2023-08-17 MED ORDER — INSULIN GLARGINE-YFGN 100 UNIT/ML ~~LOC~~ SOPN
30.0000 [IU] | PEN_INJECTOR | Freq: Every day | SUBCUTANEOUS | 0 refills | Status: DC
  Filled 2023-08-17 – 2023-09-10 (×2): qty 15, 50d supply, fill #0

## 2023-08-17 MED ORDER — DEXCOM G7 SENSOR MISC
0 refills | Status: DC
  Filled 2023-08-17: qty 3, 30d supply, fill #0

## 2023-08-17 MED ORDER — TRULICITY 3 MG/0.5ML ~~LOC~~ SOAJ
3.0000 mg | SUBCUTANEOUS | 0 refills | Status: DC
Start: 2023-08-17 — End: 2024-01-04
  Filled 2023-08-17: qty 2, 28d supply, fill #0

## 2023-09-06 ENCOUNTER — Other Ambulatory Visit: Payer: Self-pay

## 2023-09-10 ENCOUNTER — Other Ambulatory Visit (HOSPITAL_COMMUNITY): Payer: Self-pay

## 2023-09-17 ENCOUNTER — Other Ambulatory Visit (HOSPITAL_COMMUNITY): Payer: Self-pay

## 2023-10-11 ENCOUNTER — Other Ambulatory Visit (HOSPITAL_COMMUNITY): Payer: Self-pay

## 2023-10-11 ENCOUNTER — Other Ambulatory Visit: Payer: Self-pay | Admitting: Family Medicine

## 2023-10-11 ENCOUNTER — Other Ambulatory Visit: Payer: Self-pay

## 2023-10-11 DIAGNOSIS — E119 Type 2 diabetes mellitus without complications: Secondary | ICD-10-CM

## 2023-10-11 MED ORDER — DEXCOM G7 SENSOR MISC
0 refills | Status: DC
  Filled 2023-10-11 – 2023-11-19 (×2): qty 3, 30d supply, fill #0

## 2023-10-15 ENCOUNTER — Other Ambulatory Visit (HOSPITAL_COMMUNITY): Payer: Self-pay

## 2023-10-20 ENCOUNTER — Other Ambulatory Visit (HOSPITAL_COMMUNITY): Payer: Self-pay

## 2023-10-22 ENCOUNTER — Other Ambulatory Visit (HOSPITAL_COMMUNITY): Payer: Self-pay

## 2023-10-22 ENCOUNTER — Encounter: Payer: Self-pay | Admitting: Family Medicine

## 2023-10-22 ENCOUNTER — Other Ambulatory Visit: Payer: Self-pay | Admitting: Family Medicine

## 2023-10-22 DIAGNOSIS — E119 Type 2 diabetes mellitus without complications: Secondary | ICD-10-CM

## 2023-10-22 MED ORDER — INSULIN GLARGINE-YFGN 100 UNIT/ML ~~LOC~~ SOPN
30.0000 [IU] | PEN_INJECTOR | Freq: Every day | SUBCUTANEOUS | 0 refills | Status: DC
  Filled 2023-10-22 – 2023-11-16 (×3): qty 15, 50d supply, fill #0

## 2023-10-29 ENCOUNTER — Ambulatory Visit

## 2023-11-01 ENCOUNTER — Other Ambulatory Visit (HOSPITAL_COMMUNITY): Payer: Self-pay

## 2023-11-11 ENCOUNTER — Other Ambulatory Visit (HOSPITAL_COMMUNITY): Payer: Self-pay

## 2023-11-11 ENCOUNTER — Encounter (HOSPITAL_COMMUNITY): Payer: Self-pay

## 2023-11-12 ENCOUNTER — Other Ambulatory Visit (HOSPITAL_COMMUNITY): Payer: Self-pay

## 2023-11-16 ENCOUNTER — Other Ambulatory Visit (HOSPITAL_COMMUNITY): Payer: Self-pay

## 2023-11-19 ENCOUNTER — Other Ambulatory Visit (HOSPITAL_COMMUNITY): Payer: Self-pay

## 2023-11-19 ENCOUNTER — Other Ambulatory Visit: Payer: Self-pay

## 2023-12-18 ENCOUNTER — Other Ambulatory Visit: Payer: Self-pay

## 2023-12-18 ENCOUNTER — Other Ambulatory Visit: Payer: Self-pay | Admitting: Family Medicine

## 2023-12-18 DIAGNOSIS — E119 Type 2 diabetes mellitus without complications: Secondary | ICD-10-CM

## 2023-12-20 ENCOUNTER — Other Ambulatory Visit: Payer: Self-pay

## 2023-12-20 ENCOUNTER — Other Ambulatory Visit (HOSPITAL_COMMUNITY): Payer: Self-pay

## 2023-12-20 MED ORDER — DEXCOM G7 SENSOR MISC
0 refills | Status: DC
Start: 2023-12-20 — End: 2024-01-04
  Filled 2023-12-20: qty 3, 30d supply, fill #0

## 2023-12-30 ENCOUNTER — Other Ambulatory Visit (HOSPITAL_COMMUNITY): Payer: Self-pay

## 2023-12-31 ENCOUNTER — Other Ambulatory Visit (HOSPITAL_COMMUNITY): Payer: Self-pay

## 2024-01-04 ENCOUNTER — Other Ambulatory Visit (HOSPITAL_COMMUNITY): Payer: Self-pay

## 2024-01-04 ENCOUNTER — Ambulatory Visit (INDEPENDENT_AMBULATORY_CARE_PROVIDER_SITE_OTHER): Admitting: Family Medicine

## 2024-01-04 ENCOUNTER — Encounter: Admitting: Family Medicine

## 2024-01-04 VITALS — BP 149/85 | HR 98 | Ht 68.0 in | Wt 320.4 lb

## 2024-01-04 DIAGNOSIS — E119 Type 2 diabetes mellitus without complications: Secondary | ICD-10-CM

## 2024-01-04 DIAGNOSIS — Z794 Long term (current) use of insulin: Secondary | ICD-10-CM

## 2024-01-04 DIAGNOSIS — Z1231 Encounter for screening mammogram for malignant neoplasm of breast: Secondary | ICD-10-CM

## 2024-01-04 LAB — POCT GLYCOSYLATED HEMOGLOBIN (HGB A1C): HbA1c, POC (controlled diabetic range): 8.9 % — AB (ref 0.0–7.0)

## 2024-01-04 MED ORDER — DEXCOM G7 SENSOR MISC
0 refills | Status: DC
  Filled 2024-01-04 – 2024-01-23 (×2): qty 3, 30d supply, fill #0

## 2024-01-04 MED ORDER — TRULICITY 3 MG/0.5ML ~~LOC~~ SOAJ
3.0000 mg | SUBCUTANEOUS | 0 refills | Status: AC
  Filled 2024-01-04 – 2024-01-23 (×2): qty 2, 28d supply, fill #0

## 2024-01-04 NOTE — Assessment & Plan Note (Signed)
 A1c 8.9 today (was 7.2 one year ago). Reports adherence with medications. Ran out of CGMs and Trulicity - sent refills today. Referral to pharmacy team placed.

## 2024-01-04 NOTE — Patient Instructions (Addendum)
 Thank you for coming in today! Here is a summary of what we discussed:  -Please schedule an appt with Dr Koval, our pharmacist to discuss your DM regimen.  -Please check your BP at home and keep a log. We may need to increase your medications next time. Schedule a follow up with me in 1 month.  -You need a mammogram to prevent breast cancer.  Please schedule an appointment.  You can call [phone].      -For more information on contraception/birth control options, please visit the CDC contraception website. Please always use condoms to prevent unwanted pregnancy and sexually transmitted infections. Contraception and Birth Control Methods  Contraception  CDC    We are checking some labs today. If they are abnormal, I will call you. If they are normal, I will send you a MyChart message (if it is active) or a letter in the mail. If you do not hear about your labs in the next 2 weeks, please call the office.   Please call the clinic at [phone] if your symptoms worsen or you have any concerns.  Best, Dr Adele

## 2024-01-04 NOTE — Progress Notes (Signed)
    SUBJECTIVE:   Chief compliant/HPI: annual examination  [name] is a 43 y.o. who presents today for an annual exam.   History tabs reviewed and updated.   Review of systems form reviewed and notable for none.   States life has been crazy, has fallen off track with health but wants to get back on track  DM regimen: has been out of trulicity  since the summer. Jardiance , metformin . 6-7 units short acting insulin , 30 units long acting daily. Has pen needles at home  BP at home: 134/85, has been taking meds daily  Got eye exam a few weeks ago  ICU nurse in [employer]  ADHD concern- feels she had issues focusing in school as a kid. Son has a diagnosis  OBJECTIVE:   BP (!) 149/85   Pulse 98   Ht 5' 8 (1.727 m)   Wt (!) 320 lb 6.4 oz (145.3 kg)   SpO2 100%   BMI 48.72 kg/m   General: Awake and conversant, no acute distress CV: RRR, normal S1/S2, no M/R/G, Pulm: CTAB, normal work of breathing on room air, no W/R/R. Neuro: No focal deficits Psych: Appropriate mood and affect  ASSESSMENT/PLAN:   Assessment & Plan Diabetes mellitus type 2, insulin  dependent (HCC) A1c 8.9 today (was 7.2 one year ago). Reports adherence with medications. Ran out of CGMs and Trulicity - sent refills today. Referral to pharmacy team placed. Annual Examination  See AVS for age appropriate recommendations.   PHQ score 12, reviewed and discussed. No SI. Cites stress at work, parent of an 31 yo Blood pressure reviewed and not at goal. Advised checking BP at home and f/u with me in 1 month to consider adjusting BP regimen  Asked about intimate partner violence and resources given as appropriate. Single. The patient currently uses nothing for contraception. Denies sexual activity. Declines contraception today. Advised condom use. Folate recommended as appropriate, minimum of 400 mcg per day.   Considered the following items based upon USPSTF recommendations: Diabetes screening: ordered HIV  testing:discussed and declined Hepatitis C: discussed and declined Hepatitis B:discussed and declined Syphilis if at high risk: discussed and declined GC/CT not at high risk and not ordered. Lipid panel (nonfasting or fasting) discussed based upon AHA recommendations and patient declined.  Consider repeat every 4-6 years.  Reviewed risk factors for latent tuberculosis and not indicated   Discussed family history, BRCA testing not indicated. No fhx breast cancer Cervical cancer screening: prior Pap reviewed, repeat due in 2027 Breast cancer screening: ordered Colorectal cancer screening: not applicable given age.  if age 86 or over.   Follow up in 1 year or sooner if indicated.  MyChart Activation: Already signed up  Rea Raring, MD [facility]

## 2024-01-05 LAB — MICROALBUMIN / CREATININE URINE RATIO
Creatinine, Urine: 57.8 mg/dL
Microalb/Creat Ratio: 80 mg/g{creat} — ABNORMAL HIGH (ref 0–29)
Microalbumin, Urine: 46.1 ug/mL

## 2024-01-07 ENCOUNTER — Ambulatory Visit: Payer: Self-pay | Admitting: Family Medicine

## 2024-01-14 ENCOUNTER — Other Ambulatory Visit (HOSPITAL_COMMUNITY): Payer: Self-pay

## 2024-01-23 ENCOUNTER — Other Ambulatory Visit: Payer: Self-pay | Admitting: Family Medicine

## 2024-01-23 DIAGNOSIS — E119 Type 2 diabetes mellitus without complications: Secondary | ICD-10-CM

## 2024-01-23 DIAGNOSIS — I10 Essential (primary) hypertension: Secondary | ICD-10-CM

## 2024-01-24 ENCOUNTER — Other Ambulatory Visit (HOSPITAL_COMMUNITY): Payer: Self-pay

## 2024-01-24 ENCOUNTER — Other Ambulatory Visit: Payer: Self-pay

## 2024-01-24 MED ORDER — EMPAGLIFLOZIN 25 MG PO TABS
25.0000 mg | ORAL_TABLET | Freq: Every day | ORAL | 3 refills | Status: AC
  Filled 2024-01-24 (×2): qty 90, 90d supply, fill #0

## 2024-01-24 MED ORDER — LOSARTAN POTASSIUM 50 MG PO TABS
50.0000 mg | ORAL_TABLET | Freq: Every day | ORAL | 3 refills | Status: DC
  Filled 2024-01-24 (×2): qty 90, 90d supply, fill #0

## 2024-01-24 MED ORDER — AMLODIPINE BESYLATE 5 MG PO TABS
5.0000 mg | ORAL_TABLET | Freq: Every day | ORAL | 3 refills | Status: DC
  Filled 2024-01-24 (×2): qty 90, 90d supply, fill #0

## 2024-01-24 MED ORDER — HYDROCHLOROTHIAZIDE 25 MG PO TABS
12.5000 mg | ORAL_TABLET | Freq: Every day | ORAL | 3 refills | Status: DC
  Filled 2024-01-24 (×2): qty 45, 90d supply, fill #0

## 2024-01-24 MED ORDER — METFORMIN HCL ER 500 MG PO TB24
500.0000 mg | ORAL_TABLET | Freq: Two times a day (BID) | ORAL | 11 refills | Status: AC
  Filled 2024-01-24 – 2024-03-17 (×3): qty 60, 30d supply, fill #0

## 2024-01-27 ENCOUNTER — Other Ambulatory Visit (HOSPITAL_COMMUNITY): Payer: Self-pay

## 2024-02-01 ENCOUNTER — Ambulatory Visit: Admitting: Pharmacist

## 2024-02-01 ENCOUNTER — Encounter: Payer: Self-pay | Admitting: Pharmacist

## 2024-02-01 ENCOUNTER — Encounter: Payer: Self-pay | Admitting: Family Medicine

## 2024-02-01 ENCOUNTER — Ambulatory Visit: Admitting: Family Medicine

## 2024-02-01 ENCOUNTER — Other Ambulatory Visit (HOSPITAL_COMMUNITY): Payer: Self-pay

## 2024-02-01 VITALS — BP 145/91 | HR 104 | Ht 68.0 in | Wt 315.4 lb

## 2024-02-01 VITALS — BP 123/78

## 2024-02-01 DIAGNOSIS — B349 Viral infection, unspecified: Secondary | ICD-10-CM | POA: Diagnosis not present

## 2024-02-01 DIAGNOSIS — I10 Essential (primary) hypertension: Secondary | ICD-10-CM

## 2024-02-01 DIAGNOSIS — E785 Hyperlipidemia, unspecified: Secondary | ICD-10-CM | POA: Diagnosis not present

## 2024-02-01 DIAGNOSIS — E1169 Type 2 diabetes mellitus with other specified complication: Secondary | ICD-10-CM

## 2024-02-01 DIAGNOSIS — E119 Type 2 diabetes mellitus without complications: Secondary | ICD-10-CM

## 2024-02-01 DIAGNOSIS — Z794 Long term (current) use of insulin: Secondary | ICD-10-CM | POA: Diagnosis not present

## 2024-02-01 MED ORDER — LANTUS SOLOSTAR 100 UNIT/ML ~~LOC~~ SOPN
28.0000 [IU] | PEN_INJECTOR | Freq: Every day | SUBCUTANEOUS | 3 refills | Status: DC
  Filled 2024-02-01: qty 3, 10d supply, fill #0

## 2024-02-01 MED ORDER — AMLODIPINE BESYLATE 5 MG PO TABS
5.0000 mg | ORAL_TABLET | Freq: Every day | ORAL | 3 refills | Status: AC
  Filled 2024-02-01 – 2024-03-14 (×2): qty 90, 90d supply, fill #0

## 2024-02-01 MED ORDER — PROMETHAZINE HCL 12.5 MG PO TABS
12.5000 mg | ORAL_TABLET | Freq: Three times a day (TID) | ORAL | 0 refills | Status: AC | PRN
  Filled 2024-02-01 – 2024-03-17 (×3): qty 20, 7d supply, fill #0

## 2024-02-01 MED ORDER — LANTUS SOLOSTAR 100 UNIT/ML ~~LOC~~ SOPN
28.0000 [IU] | PEN_INJECTOR | Freq: Every day | SUBCUTANEOUS | 3 refills | Status: AC
  Filled 2024-02-01: qty 15, 53d supply, fill #0

## 2024-02-01 MED ORDER — OLMESARTAN MEDOXOMIL-HCTZ 40-12.5 MG PO TABS
1.0000 | ORAL_TABLET | Freq: Every day | ORAL | 3 refills | Status: AC
  Filled 2024-02-01 – 2024-03-17 (×3): qty 90, 90d supply, fill #0

## 2024-02-01 MED ORDER — OLMESARTAN-AMLODIPINE-HCTZ 40-5-12.5 MG PO TABS
1.0000 | ORAL_TABLET | Freq: Every day | ORAL | 6 refills | Status: DC
  Filled 2024-02-01: qty 30, 30d supply, fill #0

## 2024-02-01 MED ORDER — MOUNJARO 2.5 MG/0.5ML ~~LOC~~ SOAJ: 2.5000 mg | SUBCUTANEOUS | Status: AC

## 2024-02-01 NOTE — Assessment & Plan Note (Signed)
 Diabetes control has improved in the last month, down 5lb with addition of Trulicity . Reports better glucose readings on CGM. Will collect BMP and lipid panel today. Will defer med changes to [facility] pharmacist, who is seeing pt following today's visit.

## 2024-02-01 NOTE — Assessment & Plan Note (Signed)
 BP elevated x2 to 140s/90s. Taking hydrochlorothiazide  25mg , amlodipine  5mg , and losartan  50mg . Pt to see [facility] pharmacist after today's appt- will defer adjustment until then.

## 2024-02-01 NOTE — Patient Instructions (Signed)
 It was nice to see you today!    Your goal blood sugar is 80-130 before eating and less than 180 after eating.  Medication Changes: Continue Trulicity  (dulaglutide ) until supply used.  Then start Mounjaro  (tirzepatide ) 2.5mg  once weekly.  At that time REDUCE your Lantus  (insulin  glargine) to 28 units once daily.   Change blood pressure medication to  Olmesartan /amlodipine /hydrochlorothiazide  combination 1 daily  STOP losartan , hydrochlorothiazide  and amlodipine    Continue all other medication the same.   Keep up the good work with diet and exercise. Aim for a diet full of vegetables, fruit and lean meats (chicken, turkey, fish). Try to limit salt intake by eating fresh or frozen vegetables (instead of canned), rinse canned vegetables prior to cooking and do not add any additional salt to meals.

## 2024-02-01 NOTE — Patient Instructions (Addendum)
 Thank you for coming in today! Here is a summary of what we discussed:  You can call to schedule a diabetic foot exam: [facility] [address] [place] [phone]  Please schedule your mammogram as able  We are checking some labs today. If they are abnormal, I will call you. If they are normal, I will send you a MyChart message (if it is active) or a letter in the mail. If you do not hear about your labs in the next 2 weeks, please call the office.   Please call the clinic at [phone] if your symptoms worsen or you have any concerns.  Best, Dr Adele

## 2024-02-01 NOTE — Progress Notes (Cosign Needed)
° ° °  SUBJECTIVE:   CHIEF COMPLAINT / HPI:   GI upset --last few days --diarrhea, nausea, vomiting --no fevers --mom was sick with similar sx recently --has used phenergan  in the past, would like a refill today --has been drinking a lot of fluids  F/u DM2 --last visit 11/18, annual exam. Elevated A1c (8.9) and urine MCR (80)  --medications: jardiance , metformin . 6-7 units short acting insulin , 30 units long acting daily, trulicity  3mg  (restarted at last visit, had been out for several months prior to this) --has lost 5lb since last visit --glucose average around 170 --has CGM refills --seeing [facility] pharmacist, Dr Koval, following today's appointment --Lipitor 20mg  --needs BMP and lipid panel today --Eye exam documentation- [facility] [place] November 3rd --Foot exam-  podiatry referral authorized --asking about cost of insulin - $70 per month  HTN --elevated at last visit but reported some elevated home readings (140s/70s) --taking meds daily  PERTINENT  PMH / PSH: reviewed  OBJECTIVE:   BP (!) 145/91   Pulse (!) 104   Ht 5' 8 (1.727 m)   Wt (!) 315 lb 6.4 oz (143.1 kg)   LMP 01/27/2024   SpO2 100%   BMI 47.96 kg/m   - General: No acute distress. Awake and conversant. - Eyes: Normal conjunctiva, anicteric. Round symmetric pupils. - ENT: Hearing grossly intact. No nasal discharge. - Neck: Neck is supple. No masses or thyromegaly. - Respiratory: Respirations are non-labored. - Skin: No visible rashes or ulcers. - Psych: Alert and oriented. Cooperative, Appropriate mood and affect, Normal judgment. - MSK: Normal ambulation. - Neuro: Sensation and CN II-XII grossly normal.  ASSESSMENT/PLAN:   Assessment & Plan Diabetes mellitus type 2, insulin  dependent (HCC) Hyperlipidemia associated with type 2 diabetes mellitus (HCC) Diabetes control has improved in the last month, down 5lb with addition of Trulicity . Reports better glucose readings on CGM. Will collect BMP  and lipid panel today. Will defer med changes to [facility] pharmacist, who is seeing pt following today's visit. Primary hypertension BP elevated x2 to 140s/90s. Taking hydrochlorothiazide  25mg , amlodipine  5mg , and losartan  50mg . Pt to see [facility] pharmacist after today's appt- will defer adjustment until then. Viral illness Appears to have viral GI illness given sick contacts and duration of sx. Sent in phenergan  per pt request. Reviewed return precautions.     Rea Raring, MD [facility]

## 2024-02-02 ENCOUNTER — Ambulatory Visit: Payer: Self-pay | Admitting: Family Medicine

## 2024-02-02 LAB — BASIC METABOLIC PANEL WITH GFR
BUN/Creatinine Ratio: 19 (ref 9–23)
BUN: 22 mg/dL (ref 6–24)
CO2: 23 mmol/L (ref 20–29)
Calcium: 9.7 mg/dL (ref 8.7–10.2)
Chloride: 100 mmol/L (ref 96–106)
Creatinine, Ser: 1.17 mg/dL — ABNORMAL HIGH (ref 0.57–1.00)
Glucose: 148 mg/dL — ABNORMAL HIGH (ref 70–99)
Potassium: 4.4 mmol/L (ref 3.5–5.2)
Sodium: 140 mmol/L (ref 134–144)
eGFR: 60 mL/min/1.73 (ref >59)

## 2024-02-02 LAB — LIPID PANEL
Chol/HDL Ratio: 2.6 ratio (ref 0.0–4.4)
Cholesterol, Total: 153 mg/dL (ref 100–199)
HDL: 58 mg/dL (ref >39)
LDL Chol Calc (NIH): 80 mg/dL (ref 0–99)
Triglycerides: 78 mg/dL (ref 0–149)
VLDL Cholesterol Cal: 15 mg/dL (ref 5–40)

## 2024-02-02 NOTE — Assessment & Plan Note (Signed)
 Hypertension - attempted to consolidate to 3 in 1 blood pressure medication.  Unfortunately, none of the 3 in 1 treatments are covered by insurance.  - Continue amlodipine  5mg  daily - Combine ARB/thiazide to Olmesartan /hydrochlorothiazide  40/12.5mg  daily to decrease pill burden and attempt to improve control.

## 2024-02-02 NOTE — Progress Notes (Signed)
 S:     Chief Complaint  Patient presents with   Medication Management    Diabetes - Medication Review    43 y.o. female who presents for diabetes evaluation, education, and management. Patient was referred and seen by Primary Care Provider, Dr. Adele, this AM At last visit, A1C was determined to be 8.9    Today, patient arrives in good spirits and presents without any assistance. Patient states she is trying to gain improved glucose control.  Patient reports Diabetes was diagnosed in 2014.    Current diabetes medications include: Jardiance  (empagliflozin ) 25 mg, Metformin  500 mg BID, Lyumjev  (insulin  lispro) 7 units TID before meals, Semglee  (insulin  glargine) 30 units daily. Current hypertension medications include: Amlodipine  5 mg, Losartan  50 mg, HCTZ 12.5 mg Current hyperlipidemia medications include: Atorvastatin  20 mg   Patient reports adherence to taking all medications as prescribed. Concerned with cost of Semglee  (insulin  glargine).    Do you feel that your medications are working for you? yes Have you been experiencing any side effects to the medications prescribed? no   Patient denies hypoglycemic events.   Patient reported dietary habits: Eats 3 meals/day  O:   Review of Systems  All other systems reviewed and are negative.   Physical Exam Vitals reviewed.  Constitutional:      Appearance: Normal appearance.  Pulmonary:     Effort: Pulmonary effort is normal.  Neurological:     Mental Status: She is alert.  Psychiatric:        Mood and Affect: Mood normal.        Behavior: Behavior normal.        Thought Content: Thought content normal.        Judgment: Judgment normal.    Libre3 CGM Download today  % Time CGM is active: 99% Average Glucose: 185 mg/dL Glucose Management Indicator: 7.7  Glucose Variability: 26.5% (goal <36%) Time in Goal:  - Time in range 70-180: 50% - Time above range: 50% - Time below range: 0%  Lab Results  Component  Value Date   HGBA1C 8.9 (A) 01/04/2024   Vitals:   02/01/24 1219  BP: 123/78    Lipid Panel     Component Value Date/Time   CHOL 162 12/20/2020 0943   TRIG 97 12/20/2020 0943   HDL 56 12/20/2020 0943   CHOLHDL 2.9 12/20/2020 0943   CHOLHDL 3.6 03/16/2017 1056   LDLCALC 88 12/20/2020 0943   LDLCALC 155 (H) 03/16/2017 1056   Clinical Atherosclerotic Cardiovascular Disease (ASCVD): No   A/P: Diabetes longstanding diagnosed in 2014 and is much improved in control over the last two weeks, GMI 7.7  Patient appears adherent and is able to verbalize appropriate hypoglycemia management plan. Control is near optimal based on glucose readings from CGM report. Patient has desire to lose more weight over the next 6-12 months.  Goal of losing 100# in the next 12 months.  - Changed to Lantus  (insulin  glargine) for insurance preference and decreased dose of from 30 units to 28 units daily in the morning at the time of transition to Mounjaro  (tirzepatide ) .  - Changed rapid insulin  Fiasp  FlexTouch (insulin  aspart) from 7 units prior to meals  - Finish two remaining Trulicity  (dulaglutide ) 3 mg weekly injections THEN transition to Mounjaro  (tirzepatide ) 2.5mg  weekly (use sample).  - Continued SGLT2-I Jardiance  (empagliflozin ) 25 mg.  - Continued metformin  500 mg BID - Continue use of Dexcom G7 -Extensively discussed pathophysiology of diabetes, recommended lifestyle interventions, dietary effects  on blood sugar control.  -Counseled on s/sx of and management of hypoglycemia. Encouraged patient to call if she is uncomfortable with low readings or experiences hypoglycemia frequently.   Hypertension - attempted to consolidate to 3 in 1 blood pressure medication.  Unfortunately, none of the 3 in 1 treatments are covered by insurance.  - Continue amlodipine  5mg  daily - Combine ARB/thiazide to Olmesartan /hydrochlorothiazide  40/12.5mg  daily to decrease pill burden and attempt to improve control.     Written patient instructions provided. Patient verbalized understanding of treatment plan.  Total time in face to face counseling 28 minutes.    Follow-up:  Pharmacist 6 weeks PCP clinic visit in TBD Patient seen with Dr. Lennie, DO

## 2024-02-02 NOTE — Assessment & Plan Note (Signed)
 Diabetes longstanding diagnosed in 2014 and is much improved in control over the last two weeks, GMI 7.7  Patient appears adherent and is able to verbalize appropriate hypoglycemia management plan. Control is near optimal based on glucose readings from CGM report. Patient has desire to lose more weight over the next 6-12 months.  Goal of losing 100# in the next 12 months.  - Changed to Lantus  (insulin  glargine) for insurance preference and decreased dose of from 30 units to 28 units daily in the morning at the time of transition to Mounjaro  (tirzepatide ) .  - Changed rapid insulin  Fiasp  FlexTouch (insulin  aspart) from 7 units prior to meals  - Finish two remaining Trulicity  (dulaglutide ) 3 mg weekly injections THEN transition to Mounjaro  (tirzepatide ) 2.5mg  weekly (use sample).  - Continued SGLT2-I Jardiance  (empagliflozin ) 25 mg.  - Continued metformin  500 mg BID - Continue use of Dexcom G7 -Extensively discussed pathophysiology of diabetes, recommended lifestyle interventions, dietary effects on blood sugar control.

## 2024-02-04 NOTE — Progress Notes (Signed)
 Reviewed and agree with Dr Rennis plan.

## 2024-02-11 ENCOUNTER — Other Ambulatory Visit (HOSPITAL_COMMUNITY): Payer: Self-pay

## 2024-03-14 ENCOUNTER — Other Ambulatory Visit: Payer: Self-pay | Admitting: Family Medicine

## 2024-03-14 ENCOUNTER — Telehealth: Payer: Self-pay | Admitting: Pharmacist

## 2024-03-14 ENCOUNTER — Other Ambulatory Visit: Payer: Self-pay

## 2024-03-14 ENCOUNTER — Other Ambulatory Visit (HOSPITAL_COMMUNITY): Payer: Self-pay

## 2024-03-14 ENCOUNTER — Encounter (HOSPITAL_COMMUNITY): Payer: Self-pay

## 2024-03-14 ENCOUNTER — Encounter: Payer: Self-pay | Admitting: Family Medicine

## 2024-03-14 DIAGNOSIS — E119 Type 2 diabetes mellitus without complications: Secondary | ICD-10-CM

## 2024-03-14 MED ORDER — DEXCOM G7 SENSOR MISC
0 refills | Status: AC
  Filled 2024-03-14 – 2024-03-17 (×3): qty 3, 30d supply, fill #0

## 2024-03-14 MED ORDER — LYUMJEV KWIKPEN 100 UNIT/ML ~~LOC~~ SOPN
7.0000 [IU] | PEN_INJECTOR | Freq: Three times a day (TID) | SUBCUTANEOUS | 5 refills | Status: AC
  Filled 2024-03-14 – 2024-03-17 (×2): qty 15, 50d supply, fill #0

## 2024-03-14 NOTE — Telephone Encounter (Signed)
 Reviewed and agree with Dr Rennis plan.

## 2024-03-14 NOTE — Telephone Encounter (Signed)
 Attempted to contact patient for follow-up of request for refill   Left HIPAA compliant voice mail - sharing that I would refill the requested medication.  If she had questions, asked her to call main number  [phone]  Total time with patient call and documentation of interaction: 6 minutes.  Patient has appt. With me on 03/20/24

## 2024-03-17 ENCOUNTER — Other Ambulatory Visit (HOSPITAL_COMMUNITY): Payer: Self-pay

## 2024-03-17 ENCOUNTER — Other Ambulatory Visit (HOSPITAL_BASED_OUTPATIENT_CLINIC_OR_DEPARTMENT_OTHER): Payer: Self-pay

## 2024-03-18 ENCOUNTER — Other Ambulatory Visit (HOSPITAL_BASED_OUTPATIENT_CLINIC_OR_DEPARTMENT_OTHER): Payer: Self-pay

## 2024-03-20 ENCOUNTER — Ambulatory Visit: Admitting: Pharmacist

## 2024-03-20 ENCOUNTER — Other Ambulatory Visit (HOSPITAL_COMMUNITY): Payer: Self-pay

## 2024-03-20 ENCOUNTER — Other Ambulatory Visit: Payer: Self-pay

## 2024-03-20 ENCOUNTER — Other Ambulatory Visit (HOSPITAL_BASED_OUTPATIENT_CLINIC_OR_DEPARTMENT_OTHER): Payer: Self-pay

## 2024-04-04 ENCOUNTER — Ambulatory Visit: Admitting: Pharmacist
